# Patient Record
Sex: Female | Born: 1940 | Race: Black or African American | Hispanic: No | Smoking: Former smoker
Health system: Southern US, Community
[De-identification: ages and names within clinical notes are randomized; demographics above are authoritative.]

## PROBLEM LIST (undated history)

## (undated) DIAGNOSIS — E039 Hypothyroidism, unspecified: Secondary | ICD-10-CM

## (undated) DIAGNOSIS — Z86010 Personal history of colonic polyps: Secondary | ICD-10-CM

## (undated) DIAGNOSIS — I10 Essential (primary) hypertension: Secondary | ICD-10-CM

## (undated) DIAGNOSIS — Z860101 Personal history of adenomatous and serrated colon polyps: Secondary | ICD-10-CM

## (undated) DIAGNOSIS — R011 Cardiac murmur, unspecified: Secondary | ICD-10-CM

## (undated) DIAGNOSIS — M069 Rheumatoid arthritis, unspecified: Secondary | ICD-10-CM

## (undated) DIAGNOSIS — K279 Peptic ulcer, site unspecified, unspecified as acute or chronic, without hemorrhage or perforation: Secondary | ICD-10-CM

## (undated) DIAGNOSIS — K222 Esophageal obstruction: Secondary | ICD-10-CM

## (undated) DIAGNOSIS — K219 Gastro-esophageal reflux disease without esophagitis: Secondary | ICD-10-CM

## (undated) HISTORY — PX: ABDOMINAL HYSTERECTOMY: SHX81

## (undated) HISTORY — PX: FOOT SURGERY: SHX648

## (undated) HISTORY — DX: Gastro-esophageal reflux disease without esophagitis: K21.9

## (undated) HISTORY — DX: Rheumatoid arthritis, unspecified: M06.9

## (undated) HISTORY — PX: OTHER SURGICAL HISTORY: SHX169

## (undated) HISTORY — DX: Personal history of adenomatous and serrated colon polyps: Z86.0101

## (undated) HISTORY — DX: Personal history of colonic polyps: Z86.010

## (undated) HISTORY — DX: Esophageal obstruction: K22.2

## (undated) HISTORY — DX: Peptic ulcer, site unspecified, unspecified as acute or chronic, without hemorrhage or perforation: K27.9

---

## 1992-10-28 DIAGNOSIS — K279 Peptic ulcer, site unspecified, unspecified as acute or chronic, without hemorrhage or perforation: Secondary | ICD-10-CM

## 1992-10-28 HISTORY — DX: Peptic ulcer, site unspecified, unspecified as acute or chronic, without hemorrhage or perforation: K27.9

## 2000-05-06 ENCOUNTER — Ambulatory Visit (HOSPITAL_COMMUNITY): Admission: RE | Admit: 2000-05-06 | Discharge: 2000-05-06 | Payer: Self-pay | Admitting: *Deleted

## 2000-05-06 ENCOUNTER — Encounter: Payer: Self-pay | Admitting: *Deleted

## 2001-06-23 ENCOUNTER — Emergency Department (HOSPITAL_COMMUNITY): Admission: EM | Admit: 2001-06-23 | Discharge: 2001-06-23 | Payer: Self-pay | Admitting: *Deleted

## 2001-06-23 ENCOUNTER — Encounter: Payer: Self-pay | Admitting: *Deleted

## 2001-08-31 ENCOUNTER — Encounter (HOSPITAL_COMMUNITY): Admission: RE | Admit: 2001-08-31 | Discharge: 2001-09-30 | Payer: Self-pay | Admitting: Orthopedic Surgery

## 2001-09-30 ENCOUNTER — Encounter (HOSPITAL_COMMUNITY): Admission: RE | Admit: 2001-09-30 | Discharge: 2001-10-30 | Payer: Self-pay | Admitting: Orthopedic Surgery

## 2001-11-04 ENCOUNTER — Encounter (HOSPITAL_COMMUNITY): Admission: RE | Admit: 2001-11-04 | Discharge: 2001-12-04 | Payer: Self-pay | Admitting: Orthopedic Surgery

## 2002-01-21 ENCOUNTER — Encounter: Payer: Self-pay | Admitting: Rheumatology

## 2002-01-21 ENCOUNTER — Encounter (HOSPITAL_COMMUNITY): Admission: RE | Admit: 2002-01-21 | Discharge: 2002-02-20 | Payer: Self-pay | Admitting: Rheumatology

## 2002-02-25 ENCOUNTER — Encounter (HOSPITAL_COMMUNITY): Admission: RE | Admit: 2002-02-25 | Discharge: 2002-03-27 | Payer: Self-pay | Admitting: Rheumatology

## 2002-04-22 ENCOUNTER — Encounter (HOSPITAL_COMMUNITY): Admission: RE | Admit: 2002-04-22 | Discharge: 2002-05-22 | Payer: Self-pay | Admitting: Rheumatology

## 2002-07-08 ENCOUNTER — Encounter (HOSPITAL_COMMUNITY): Admission: RE | Admit: 2002-07-08 | Discharge: 2002-08-07 | Payer: Self-pay | Admitting: Rheumatology

## 2002-08-24 ENCOUNTER — Encounter (HOSPITAL_COMMUNITY): Admission: RE | Admit: 2002-08-24 | Discharge: 2002-09-23 | Payer: Self-pay | Admitting: Rheumatology

## 2002-08-31 ENCOUNTER — Ambulatory Visit (HOSPITAL_COMMUNITY): Admission: RE | Admit: 2002-08-31 | Discharge: 2002-08-31 | Payer: Self-pay | Admitting: Family Medicine

## 2002-08-31 ENCOUNTER — Encounter: Payer: Self-pay | Admitting: Family Medicine

## 2002-09-30 ENCOUNTER — Encounter (HOSPITAL_COMMUNITY): Admission: RE | Admit: 2002-09-30 | Discharge: 2002-10-30 | Payer: Self-pay | Admitting: Rheumatology

## 2002-10-28 DIAGNOSIS — K222 Esophageal obstruction: Secondary | ICD-10-CM

## 2002-10-28 HISTORY — PX: ESOPHAGOGASTRODUODENOSCOPY: SHX1529

## 2002-10-28 HISTORY — PX: COLONOSCOPY: SHX174

## 2002-10-28 HISTORY — DX: Esophageal obstruction: K22.2

## 2002-11-04 ENCOUNTER — Encounter (HOSPITAL_COMMUNITY): Admission: RE | Admit: 2002-11-04 | Discharge: 2002-12-04 | Payer: Self-pay | Admitting: Rheumatology

## 2002-12-23 ENCOUNTER — Encounter (HOSPITAL_COMMUNITY): Admission: RE | Admit: 2002-12-23 | Discharge: 2003-01-22 | Payer: Self-pay | Admitting: Rheumatology

## 2003-02-08 ENCOUNTER — Encounter (HOSPITAL_COMMUNITY): Admission: RE | Admit: 2003-02-08 | Discharge: 2003-03-10 | Payer: Self-pay | Admitting: Rheumatology

## 2003-03-15 ENCOUNTER — Encounter (HOSPITAL_COMMUNITY): Admission: RE | Admit: 2003-03-15 | Discharge: 2003-04-14 | Payer: Self-pay | Admitting: Rheumatology

## 2003-05-25 ENCOUNTER — Encounter: Payer: Self-pay | Admitting: Family Medicine

## 2003-05-25 ENCOUNTER — Ambulatory Visit (HOSPITAL_COMMUNITY): Admission: RE | Admit: 2003-05-25 | Discharge: 2003-05-25 | Payer: Self-pay | Admitting: Family Medicine

## 2003-05-26 ENCOUNTER — Encounter: Payer: Self-pay | Admitting: Family Medicine

## 2003-05-26 ENCOUNTER — Ambulatory Visit (HOSPITAL_COMMUNITY): Admission: RE | Admit: 2003-05-26 | Discharge: 2003-05-26 | Payer: Self-pay | Admitting: Family Medicine

## 2003-06-28 ENCOUNTER — Ambulatory Visit (HOSPITAL_COMMUNITY): Admission: RE | Admit: 2003-06-28 | Discharge: 2003-06-28 | Payer: Self-pay | Admitting: Internal Medicine

## 2003-07-21 ENCOUNTER — Encounter (HOSPITAL_COMMUNITY): Admission: RE | Admit: 2003-07-21 | Discharge: 2003-08-20 | Payer: Self-pay | Admitting: Rheumatology

## 2003-08-22 ENCOUNTER — Encounter (HOSPITAL_COMMUNITY): Admission: RE | Admit: 2003-08-22 | Discharge: 2003-09-21 | Payer: Self-pay | Admitting: Rheumatology

## 2006-05-20 ENCOUNTER — Ambulatory Visit (HOSPITAL_COMMUNITY): Admission: RE | Admit: 2006-05-20 | Discharge: 2006-05-20 | Payer: Self-pay | Admitting: Family Medicine

## 2006-08-28 HISTORY — PX: COLONOSCOPY: SHX174

## 2006-09-10 ENCOUNTER — Ambulatory Visit: Payer: Self-pay | Admitting: Internal Medicine

## 2006-09-10 ENCOUNTER — Ambulatory Visit (HOSPITAL_COMMUNITY): Admission: RE | Admit: 2006-09-10 | Discharge: 2006-09-10 | Payer: Self-pay | Admitting: Internal Medicine

## 2007-05-29 ENCOUNTER — Ambulatory Visit (HOSPITAL_COMMUNITY): Admission: RE | Admit: 2007-05-29 | Discharge: 2007-05-29 | Payer: Self-pay | Admitting: Family Medicine

## 2010-09-28 ENCOUNTER — Ambulatory Visit (HOSPITAL_COMMUNITY)
Admission: RE | Admit: 2010-09-28 | Discharge: 2010-09-28 | Payer: Self-pay | Source: Home / Self Care | Attending: Family Medicine | Admitting: Family Medicine

## 2011-01-29 ENCOUNTER — Ambulatory Visit (HOSPITAL_COMMUNITY)
Admission: RE | Admit: 2011-01-29 | Discharge: 2011-01-29 | Disposition: A | Discharge status: Home / Self Care | Payer: Medicare Other | Source: Ambulatory Visit | Attending: Cardiovascular Disease | Admitting: Cardiovascular Disease

## 2011-01-29 ENCOUNTER — Other Ambulatory Visit (HOSPITAL_COMMUNITY): Payer: Self-pay | Admitting: Cardiovascular Disease

## 2011-01-29 DIAGNOSIS — Z87891 Personal history of nicotine dependence: Secondary | ICD-10-CM | POA: Insufficient documentation

## 2011-01-29 DIAGNOSIS — J40 Bronchitis, not specified as acute or chronic: Secondary | ICD-10-CM | POA: Insufficient documentation

## 2011-03-15 NOTE — Consult Note (Signed)
NAMECICELY, ORTNER NO.:  [id number]   MEDICAL RECORD NO.:  [id number]                   PATIENT TYPE:   LOCATION:                                       FACILITY:   PHYSICIAN:  Aundra Dubin, M.D.            DATE OF BIRTH:   DATE OF CONSULTATION:  09/30/2002  DATE OF DISCHARGE:                                   CONSULTATION   CHIEF COMPLAINT:  Rheumatoid arthritis.   HISTORY OF PRESENT ILLNESS:  Labs on July 08, 2002 showed an AST 57 and  I called her to reduce the methotrexate to 12.5 mg weekly.  Labs repeated on  August 24, 2002 showed an AST of 36.  She reports being quite stiff and  achy, particularly in her hands.  She is taking 4000 mg of Tylenol most  days.  She is stiff in the mornings for about 30 minutes.  There has been no  URI's, fever, cough, nausea, shortness of breath.  Her weight is up 2  pounds.  One of her main complaints today is having significant heartburn.   MEDICATIONS:  1. Methotrexate 12.5 mg weekly.  2. Folic acid 1 mg q.d.  3. Multivitamin q.d.  4. Calcium 600 mg b.i.d.  5. Fish oil rare.   PHYSICAL EXAMINATION:  VITAL SIGNS:  Weight 223 pounds, blood pressure  110/60, respirations 16.  GENERAL:  No distress.  SKIN:  Clear.  LUNGS:  Clear.  NECK:  Negative JVD.  No adenopathy.  HEART:  Regular.  No murmur.  EXTREMITIES:  Lower extremities:  No edema.  MUSCULOSKELETAL:  The hands show mildly active RA with synovitis to the  PIP's, MCP's, and wrists.  These areas are tender more on the right then  left hand.  Elbows and shoulders good range of motion.  Back nontender.  Hips good range of motion.  The knees were cool and replaced.  The ankles  and feet have slight tenderness.   ASSESSMENT AND PLAN:  1. Rheumatoid arthritis.  I believe she is having moderate flaring to the     hands and chronic synovitis.  I will have her increase the methotrexate     to 17.5 mg weekly.  I have advised her to lower  the Tylenol to no more     than 2-4 tablets a day.  We were giving her an injection of 120 of Depo-     Medrol IM.  We will check labs again in five weeks.  2. Transient increased AST.  3. Heartburn.  I have suggested that she take Prilosec 20 mg q.d.   Other labs from August 24, 2002 showed an albumin of 4.3, WBC 4.6, HGB  12.3, PLT 203, creatinine 0.9.  She will return in three months.  Aundra Dubin, M.D.    WWT/MEDQ  D:  09/30/2002  T:  09/30/2002  Job:  [id number]   cc:   Patrica Duel, M.D.  [address]  [place]  [place] [address]  Fax: [phone]

## 2011-03-15 NOTE — Op Note (Signed)
NAME:  Loretta Hunt, Loretta Hunt                         ACCOUNT NO.:  [id number]   MEDICAL RECORD NO.:  [id number]                   PATIENT TYPE:  AMB   LOCATION:  DAY                                  FACILITY:  [facility]   PHYSICIAN:  R. Roetta Sessions, M.D.              DATE OF BIRTH:  1941-09-17   DATE OF PROCEDURE:  06/28/2003  DATE OF DISCHARGE:                                 OPERATIVE REPORT   PROCEDURE:  Esophagogastroduodenoscopy with Elease Hashimoto dilation followed by  colonoscopy and snare polypectomy.   INDICATIONS FOR PROCEDURE:  The patient is a 70 year old lady with  esophageal dysphagia, distal esophageal narrowing on upper GI series. She  has never had her lower GI tract evaluated. She comes for EGD and  colonoscopy. This approach has been discussed with the patient previously.  The potential risks, benefits, and alternatives have been reviewed,  questions answered. Please see my June 20, 2003 consultation note.   MONITORING:  O2 saturation, blood pressure, pulse, and respirations were  monitored throughout the entire procedure.   CONSCIOUS SEDATION:  Versed 3 mg IV, Demerol 75 mg IV. Cetacaine spray for  topical oropharyngeal anesthesia.   INSTRUMENTS:  Olympus video chip adult gastroscope and colonoscope.   EGD FINDINGS:  Examination of the tubular esophagus revealed a stellate  shaped distal esophageal ulcer at the EG junction, there was also a  Schatzki's ring. The esophageal mucosa otherwise appeared normal. The EG  junction easily traversed.   STOMACH:  The gastric cavity was empty and insufflated well with air. A  thorough examination of the gastric mucosa including a retroflexed view of  the proximal stomach and esophagogastric junction demonstrated only a small  hiatal hernia.  The pylorus was patent and easily traversed.   DUODENUM:  The bulb and second portion appeared normal.   THERAPEUTIC/DIAGNOSTIC MANEUVERS:  A 56 French Maloney dilator was passed  with ease  and a 28 [place] Malone dilator was subsequently passed with slight  resistance upon full insertion.  A look back revealed the ring had been  partially disrupted. Subsequently four corner bites with cold biopsy  forceps were taken to disrupt the ring additionally. This was done without  difficulty or apparent complications.   The patient tolerated the procedure well and was prepared for colonoscopy.  Digital rectal examination revealed no abnormalities.   ENDOSCOPIC FINDINGS:  Unfortunately the prep was marginal.   RECTUM:  Examination of the rectal mucosa including retroflexed view of the  anal verge revealed no abnormalities aside from some pigmentation to the  mucosa.   COLON:  The colonic mucosa was surveyed from the rectosigmoid junction  through the left transverse right colon to the area of the appendiceal  orifice, ileocecal valve and cecum. These structures were seen and  photographed for the record. The patient again was noted to have diffusely  pigmented mucosa all the way to the cecum. The patient had a  couple of  sigmoid diverticula and had some polyps in the mid ascending colon, the  largest was 6 mm. There was a 4 mm sessile polyp and then a 2 mm polyp,  please see photos. From the level of the cecum and ileocecal valve, the  scope was slowly and cautiously withdrawn. All previously mentioned mucosal  surfaces were again seen. The largest polyp in the mid ascending colon was  resected with snare as was the middle sized polyp. The smallest polyp was  obliterated with the tip of the snare cautery unit. The polyps resected were  recovered through the scope.  No other abnormalities were observed. The  patient tolerated the procedure well and was reacted in endoscopy.   IMPRESSION:  1. EGD.  Stellate shaped distal esophageal ulcer consistent with mild     ulcerative reflux esophagitis, Schatzki's ring dilated and disrupted as     described above, otherwise, normal esophagus.  Small hiatal hernia,     otherwise, normal stomach. Normal D1 and D2.  2. Colonoscopy findings.  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  3. Small polyps in the ascending colon removed and or distorted as described     above.   RECOMMENDATIONS:  1. No arthritis medications, aspirin, etc. for 10 days. Antireflux     literature provided to Ms. Loretta Hunt. She is to continue taking Protonix     40 mg orally daily.  2. Followup on path.  3. Office visit with me in one month.                                               Jonathon Bellows, M.D.    RMR/MEDQ  D:  06/28/2003  T:  06/28/2003  Job:  [id number]   cc:   Corrie Mckusick, M.D.  [address] [place]  [place]  [place] [address]  Fax: [phone]   Aundra Dubin, M.D.
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
## 2011-03-15 NOTE — Consult Note (Signed)
NAME:  Sadey, Yandell NO.:  [id number]   MEDICAL RECORD NO.:  [id number]                   PATIENT TYPE:   LOCATION:                                       FACILITY:   PHYSICIAN:  Aundra Dubin, M.D.            DATE OF BIRTH:   DATE OF CONSULTATION:  03/31/2003  DATE OF DISCHARGE:                                   CONSULTATION   CHIEF COMPLAINT:  Rheumatoid arthritis.   HISTORY OF PRESENT ILLNESS:  Ms. Vellucci had repeat labs in mid April  showing an AST 96.  We called and had her reduced to 10 mg of methotrexate  each week.  Repeat labs on Mar 15, 2003, showed an AST 32, creatinine 1.0,  albumin 4.1, WBC 7.4, HGB 13.0, PLT 214.  She is now on 12.5 mg of  methotrexate.  She reports that she is doing pretty well.  She aches in her  lower legs.  Her knees are replaced, and these will ache occasionally.  She  feels that her hands and wrists are not active at this time.  There has been  no URI, fever, cough, shortness of breath.  She was having possibly an upper  respiratory infection in mid April versus allergies.  There was no fever at  that time.   MEDICATIONS:  1. Methotrexate 12.5 mg q.week.  2. Folic acid 1 mg daily.  3. Calcium 600 mg b.i.d.  4. Multivitamin.  5. Tylenol p.r.n.  6. Nomie juice.   PHYSICAL EXAMINATION:  VITAL SIGNS:  Blood pressure 130/80, respirations 14,  pulse 74.  GENERAL APPEARANCE:  No distress.  LUNGS:  Clear.  HEART:  Regular, no murmur.  EXTREMITIES:  Lower extremities, no edema.  NECK:  Negative JVD.  BACK:  Nontender.  MUSCULOSKELETAL:  She has minor synovitis to the MCPs and wrist which are  cool and have slight tenderness.  Elbows extend fully.  No nodules.  Shoulders move with stiffness and are slightly uncomfortable.  The knees are  replaced and are mildly warm.  The ankles and feet have no tenderness.   ASSESSMENT/PLAN:  1. Rheumatoid arthritis.  She is stable and we will continue the  methotrexate 12.5 mg q.week.  She had x-rays of her hands in March 2003,     and I will have her hands x-rayed on return.  We will check labs again in     mid July and she will return in four months.  2.   Dictation ended at this point.                                               Aundra Dubin, M.D.    WWT/MEDQ  D:  03/31/2003  T:  03/31/2003  Job:  [id number]  cc:   Bonne Dolores, M.D.  [address], [place]  [place] [address]  Fax: [phone]

## 2011-03-15 NOTE — Op Note (Signed)
Loretta Hunt, Loretta Hunt               ACCOUNT NO.:  [id number]   MEDICAL RECORD NO.:  [id number]          PATIENT TYPE:  AMB   LOCATION:  DAY                           FACILITY:  [facility]   PHYSICIAN:  R. Roetta Sessions, M.D. DATE OF BIRTH:  October 28, 1941   DATE OF PROCEDURE:  09/10/2006  DATE OF DISCHARGE:                                 OPERATIVE REPORT   Surveillance colonoscopy.   INDICATIONS FOR PROCEDURE:  The patient is a 70 year old lady with history  of colonic adenomas and polyps, last colonoscopy 2004.  She is devoid of any  lower GI tract symptoms.  Colonoscopy now being done as a surveillance  maneuver.  This approach has discussed with the patient at length.  Potential risks, benefits and alternatives have been reviewed, questions  answered, she is agreeable.  Please see documentation in  the medical  record.   PROCEDURE NOTE:  O2 saturation, blood pressure, pulse and respirations  monitored entire procedure.   CONSCIOUS SEDATION:  Versed 3 mg IV, Demerol 75 mg IV, in divided doses.   INSTRUMENT USED:  Olympus video chip system.   FINDINGS:  Digital rectal exam revealed no abnormalities.   ENDOSCOPIC FINDINGS:  The prep was suboptimal but doable.  Rectum:  Examination of rectal mucosa including retroflex view of the anal verge  revealed anal papilla and internal hemorrhoids, otherwise rectal mucosa  appeared normal.  Colon:  Colonic mucosa was surveyed from the rectosigmoid junction through  the left, transverse, right colon and area of appendiceal orifice, ileocecal  valve and cecum.  These structures well seen and photographed for the  record.  From this level, the scope was slowly and cautiously withdrawn.  All previously mentioned mucosal surfaces were again seen.  The patient had  a long tortuous colon requiring external abdominal pressure and changing the  patient's position to reach the cecum.  The patient had diffusely pigmented  colonic mucosa consistent with  melanosis coli, scattered pancolonic  diverticula.  No evidence of polyp or neoplasm.  The patient tolerated the  procedure well, was reacted in endoscopy.   IMPRESSION:  Anal papilla, internal hemorrhoids, otherwise normal rectum,  long tortuous colon, pancolonic diverticula, melanosis coli.   RECOMMENDATIONS:  1. Diverticulosis literature provided Loretta Hunt..  2. Repeat colonoscopy 5 years.      Jonathon Bellows, M.D.  Electronically Signed     RMR/MEDQ  D:  09/10/2006  T:  09/10/2006  Job:  [id number]   cc:   Corrie Mckusick, M.D.  Fax: [phone]

## 2011-03-15 NOTE — Consult Note (Signed)
   Loretta Hunt, WAUNEKA NO.:  [id number]   MEDICAL RECORD NO.:  [id number]                   PATIENT TYPE:   LOCATION:                                       FACILITY:   PHYSICIAN:  Aundra Dubin, M.D.            DATE OF BIRTH:   DATE OF CONSULTATION:  12/23/2002  DATE OF DISCHARGE:                                   CONSULTATION   CHIEF COMPLAINT:  Rheumatoid arthritis.   HISTORY OF PRESENT ILLNESS:  The patient returns reporting that she is  feeling considerably better then she did in December.  At that time, we gave  her an injection of 120 mg of Depo-Medrol, which helped fairly quickly.  She  has been on the increased dose of methotrexate and is now feeling better.  She has little stiffness in the mornings rated at about 20 minutes.  Her  hands and wrists are much improved and there is only mild achiness.  She is  sleeping well.  Her weight is stable.  There has been no URI's, fever,  cough, nausea, stomatitis, or shortness of breath.   MEDICATIONS:  1. Methotrexate 12.5 mg weekly.  2. Folic acid 1 mg daily.  3. Calcium 600 mg b.i.d.  4. Multivitamin daily.   PHYSICAL EXAMINATION:  VITAL SIGNS:  Weight 222 pounds, blood pressure  100/70, respirations 18.  GENERAL:  She appears well.  NECK:  Negative JVD.  LUNGS:  Clear.  BACK:  Nontender.  HEART:  Regular.  MUSCULOSKELETAL:  There is mild chronic synovitis of the MCP's and wrist,  which is cool and essentially nontender.  Elbows extend fully, no nodules.  Shoulders good range of motion.  Knees, ankles, and feet have a good range  of motion and all are nontender and there is slight knee crepitation.    ASSESSMENT AND PLAN:  Rheumatoid arthritis.  She is stable and doing well  and we will continue the methotrexate and folic acid as above.  Labs checked  on November 04, 2002 showed a creatinine 1, albumin 3.8, AST 28, WBC 5.3, HCB  12.2, PLT 202.  Labs will be checked in two weeks and  then three months from  that point.   I will see her back in four months.                                               Aundra Dubin, M.D.    WWT/MEDQ  D:  12/23/2002  T:  12/23/2002  Job:  [id number]   cc:   Patrica Duel, M.D.  [address]  [place]  [place] [address]  Fax: [phone]

## 2011-03-15 NOTE — Consult Note (Signed)
[facility]  Patient:    Loretta Hunt, Loretta Hunt Visit Number: [id number] MRN: [id number]          Service Type: RHE Location: [facility] Attending Physician:  Aundra Dubin Dictated by:   Aundra Dubin, M.D. Proc. Date: 03/18/02 Admit Date:  02/25/2002   CC:         Patrica Duel, M.D.   Consultation Report  CHIEF COMPLAINT:  Rheumatoid arthritis.  BRIEF HISTORY:  The patient labs on January 21, 2002, showed an AST 53, and ALT 42, albumin 3.9, and creatinine 0.9.  We contacted her and had her take only 12.5 mg of methotrexate a week.  Repeat labs on Feb 25, 2002, showed an AST 39, creatinine 1.0, albumin 4.1, and a normal CBC.  Other labs from Mar 23, 2002, showed an RF 35 (0-20) and an ESR of 34.  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  The patient is reporting that she is hurting quite a bit in the hands and wrists.  She points to the MCPs. She says her knees, ankles and feet are not bothering her at this time.  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  There has been no URI, fevers, cough, nausea, stomatitis, or shortness of breath.  Her weight is stable  MEDICATIONS: 1. Methotrexate 12.5 q. week 2. Folic acid 1 mg q. day. 3. Multivitamin. 4. Vitamin E, calcium 600 mg q.d. 5. Fish oil p.r.n.  PHYSICAL EXAMINATION:  VITAL SIGNS:  Weight 220 lbs, blood pressure 130/80, respirations 18, pulse 72.  GENERAL:  She appears well.  SKIN:  Clear.  LUNGS:  Clear.  HEART:  Regular.  EXTREMITIES:  Lower extremities no edema.  MUSCULOSKELETAL:  She shows synovitis consistent with RA across the MCPs and in the wrist.  These areas were cool but moderately tender.  Elbows have good range of motion, the shoulders have decreased range of motion and were stiff. Back:  Nontender.  Hips: Good range  of motion.  The knees were replaced and Flexeril to 105 degrees bilaterally.  The ankles and feet were nonswollen and nontender.  Her gait was nonantalgic.  ASSESSMENT AND PLAN:  Rheumatoid arthritis.  The liver enzymes are now normal and I will have her increase the methotrexate to 17.5 mg q. week.  She reports that she had some type of problems with taking this amount of MTX.  I have her take 4 pills q. Wednesday a.m. and 3 pills q. Thursday a.m. all well-nourished 24 hours.  We will check usual labs in about 4 weeks after the increased MTX.  Her x-rays look quite good and this is probably due to the fact that she has been on methotrexate for several years.  FOLLOWUP:  She will return in about 2 months. Dictated by:   Aundra Dubin, M.D. Attending Physician:  Aundra Dubin DD:  03/18/02 TD:  03/20/02 Job: [id number]

## 2011-03-15 NOTE — Consult Note (Signed)
Loretta Hunt, BERHOW NO.:  [id number]   MEDICAL RECORD NO.:  [id number]                    PATIENT TYPE:   LOCATION:                                       FACILITY:   PHYSICIAN:  Aundra Dubin, M.D.            DATE OF BIRTH:   DATE OF CONSULTATION:  07/08/2002  DATE OF DISCHARGE:                                   CONSULTATION   CHIEF COMPLAINT:  Rheumatoid arthritis.   HISTORY:  This is the first office visit since Mar 18, 2002 for the patient.  She had to cancel an appointment and push the office visit back.  She did go  for laboratories on April 22, 2002, and AST was slightly high at 45.  Other  laboratories showed WBC 5.6, HGB 12.7, PLT 223,000, creatinine 1.0, albumin  4.0.  She was contacted and reduced the methotrexate to 15 mg every week.  At this point she is saying that she is quite stiff.  When asked how long  this goes on in the morning, it is only for 15-20 minutes.  She also has  moderate fatigue.  She is an active woman.  She continues to exercise and do  a great deal of activities.  There has been no URI, fever, cough, nausea,  shortness of breath, stomatitis, back pain, polyuria, or polydipsia.  Her  overall aching is fairly mild.  It seems to involve the wrists and feet.   MEDICATIONS:  1. Methotrexate 15 mg every week.  2. Folic acid 1 mg every day.  3. Multivitamin every day.  4. Vitamin E every day.  5. Calcium every day.  6. Fish oil p.r.n.   PHYSICAL EXAMINATION:  VITAL SIGNS:  Weight 221 pounds, blood pressure  140/80, respirations 18.  GENERAL:  She appears healthy.  SKIN:  Clear.  LUNGS:  Clear.  HEART:  Regular.  No murmur.  NECK:  Negative JVD.  MUSCULOSKELETAL:  She has mild chronic swelling to the MCPs and wrists.  These areas are cool and slightly tender.  Elbows, shoulders have a good  range of motion.  The back was nontender.  The knees are replaced and were  cool.  The left ankle, which has a surgical  screw, is warm and tender.  The  right ankle and bilateral MTPs are nontender.   ASSESSMENT AND PLAN:  Rheumatoid arthritis.  She is stable on 15 mg  __________ and we will continue at that level.  We will check usual  laboratories at this time.  She will continue with folic acid.  She  occasionally uses ibuprofen, which is fine, and also Tylenol up to 500 mg  t.i.d., which is okay.   I have discussed with her the arthritis itself is frequently associated with  significant fatigue.  I suspect this is what is going on.  She has not been  anemic in the past.  She will return in three months.                                               Aundra Dubin, M.D.    WWT/MEDQ  D:  07/08/2002  T:  07/09/2002  Job:  [id number]   cc:   Jonell Cluck, M.D.  [address], [address]  [place]  [place] [address]  Fax: [phone]

## 2011-03-15 NOTE — Consult Note (Signed)
NAME:  Loretta, Hunt NO.:  [id number]   MEDICAL RECORD NO.:  [id number]                   PATIENT TYPE:   LOCATION:                                       FACILITY:   PHYSICIAN:  Aundra Dubin, M.D.            DATE OF BIRTH:   DATE OF CONSULTATION:  DATE OF DISCHARGE:                                   CONSULTATION   CHIEF COMPLAINT:  Rheumatoid arthritis.   Loretta Hunt returns reporting that her hands and wrists were moderately  aching.  She says though she has minimal morning stiffness of about 10  minutes.  She has had some cough and allergy problems, but is not short of  breath and there has been no fever.  Her weight is stable. There is no  polyuria or polydipsia, visual changes or significant headaches.   Labs from May 26, 2003 shows a WBC 5.0, hemoglobin 12.6, platelets 186.  Glucose 91, creatinine 1.1, AST 24, albumin 4.2.  She has had difficulties  with swallowing and some chest pain over the last couple of months. She saw  Dr. Jena Gauss and has been found to have a hiatal hernia, and is now on Prevacid  which has helped.  She also had a colonoscopy a month ago which found some  polyps.   MEDICINES:  1. Methotrexate 12.5 mg every week.  2. Folic acid 1 mg daily.  3. Tylenol p.r.n.  4. Calcium 600 mg b.i.d.  5. Multivitamin.  6. Prevacid 40 mg daily.  7. Clarinex p.r.n.   PHYSICAL EXAMINATION:  VITAL SIGNS:  Weight 218 pounds.  Blood pressure  120/60, respirations 16.  GENERAL:  No distress.  SKIN: Clear.  LUNGS:  Clear.  HEART:  Regular no murmurs.  MUSCULOSKELETAL EXAM: The hand shows synovitis to the PIPs, MCPs and wrists  which were moderately tender, but all quite cool.  There is some pain with  wrist flexion.  Elbows and shoulders good range of motion with moderate  shoulder stiffness.  The knees are cool and are replaced.  Ankles and feet  were nontender.   ASSESSMENT/PLAN:  1. Rheumatoid arthritis.  Overall she is  fairly stable, but she still has     significant pain with mild palpation to the hands and wrists.  I have     asked her to increase the methotrexate to 15 mg every week.  There is     some resistance     in doing this, and I am not sure if she will or not.  She will return     during the week of October 22 for labs.  2. Heartburn and hiatal hernia.  This has improved with the Prevacid.  3. She will return in mid-December to see me in my [place] office.      ___________________________________________  Aundra Dubin, M.D.   WWT/MEDQ  D:  07/21/2003  T:  07/21/2003  Job:  [id number]   cc:   Patrica Duel, M.D.  [address]  [place]  [place] [address]  Fax: [phone]

## 2011-03-15 NOTE — Consult Note (Signed)
NAME:  Loretta Hunt, Loretta Hunt NO.:  [id number]   MEDICAL RECORD NO.:  [id number]                   PATIENT TYPE:   LOCATION:                                       FACILITY:   PHYSICIAN:  R. Roetta Sessions, M.D.              DATE OF BIRTH:  May 02, 1941   DATE OF CONSULTATION:  06/20/2003  DATE OF DISCHARGE:                                   CONSULTATION   REFERRING PHYSICIAN:  Corrie Mckusick, M.D.   REASON FOR CONSULTATION:  Abnormal upper GI series and dysphagia.   HISTORY OF PRESENT ILLNESS:  Loretta Hunt is a pleasant, 70 year old,  African-American female sent over at the request of Dr. Phillips Odor to further  evaluate intermittent esophageal dysphagia.  Occasional, Loretta Hunt feels  that food sticks behind her breast bone.  Sometimes she has to vomit.  Recent upper GI series demonstrated small hiatal hernia with concentric  narrowing of the EG junction.  The barium pill passed at this level, but the  barium did hang up at this area.  This was actually an esophagogram.  She  has reflux symptoms daily.  She takes Tums after almost every meal.  She was  started on Protonix 4 mg daily, but does not take this medication every day.  She does not like taking medication and takes it sparingly.  She has not had  any melena or rectal bleeding with no change in bowel habits.  She has never  had a colonoscopy.  I saw this nice lady back in 1994, when she was found to  have a gastric ulcer in upper GI series.  EGD confirmed gastric ulcer.  She  had evidence of H. pylori with a positive CLOtest.  She was treated with  triple drug therapy and has done well since.  She does not use any  nonsteroidal agents.  She has a history of rheumatoid arthritis and takes  methotrexate.   PAST MEDICAL HISTORY:  1. Gastroesophageal reflux disease.  2. H. pylori related peptic ulcer disease.  3. Rheumatoid arthritis followed by Dr. Kellie Simmering.   PAST SURGICAL HISTORY:  1. Left ankle  surgery.  2. Bilateral knee replacements in 1994 and 1995.   CURRENT MEDICATIONS:  1. Vitamin supplementation.  2. Claritin D p.r.n.  3. B-complex.  4. Tylenol for arthritis.  5. Protonix 40 mg daily.  6. Calcium two tablets daily.  7. Folic acid 1 mg daily.  8. Methotrexate once weekly.   ALLERGIES:  OVER-THE-COUNTER ANTIHISTAMINES.   FAMILY HISTORY:  Father succumbed to stomach ulcer.  Mother had arthritis  with no history of chronic GI or liver disease, otherwise as far as  neoplasia, etc. is concerned.   SOCIAL HISTORY:  She has three children.  She is widowed.  She is retired  from the [employer].  She quit smoking 10 years ago.  No  alcohol.   REVIEW OF SYMPTOMS:  No  chest pain, dyspnea on exertion.  No fevers, chills,  no change in weight.   PHYSICAL EXAMINATION:  GENERAL:  This is a pleasant, 70 year old lady  resting comfortably.  Weight 224 pounds.  Height 6 feet.  VITAL SIGNS:  Temperature 98.5, blood pressure 120/76, pulse 72.  SKIN:  Warm and dry.  HEENT:  No scleral icterus.  JVD is not prominent.  CHEST:  Lungs clear to auscultation.  CARDIAC:  Regular rate and rhythm without murmurs, rubs or gallops.  BREASTS:  Deferred.  ABDOMEN:  Nondistended, positive bowel sounds.  Soft, nontender without  appreciable mass or organomegaly.  EXTREMITIES:  No edema.  RECTAL:  Deferred at the time of colonoscopy.   LABORATORY DATA AND X-RAY FINDINGS:  On May 26, 2003, CBC with white count  5000, H&H 12.6 and 38.9, MCV 89.  Chem 20 was completely normal.  Cholesterol profile with LDL 50, HDL 38, total cholesterol 161.   IMPRESSION:  Loretta Hunt is a pleasant, 70 year old lady with  intermittent esophageal dysphagia and frequent gastroesophageal reflux  disease symptoms.  A recent upper gastrointestinal series demonstrated  concentric narrowing at the esophageal junction, although barium pill passed  without difficulty.  The barium appeared to hold  up.  We know she has a  hiatal hernia.  She may have underlying Schatzki's ring.  She has a history  of Helicobacter pylori related peptic ulcer disease.  She has been treated  for Helicobacter pylori.  As a separate issue, she notes she has never had a  colonoscopy and would like to get a colonoscopy performed.   RECOMMENDATIONS:  Given her symptoms and findings on upper GI series, she  needs an EGD with possible esophageal dilation.  I have discussed this  approach with her.  I have also offered her a colonoscopy at the same time.  Potential risks, benefits and alternatives have been reviewed for both  procedures.  Questions were answered and she is agreeable.  Will plan to  perform colonoscopy and EGD with possible dilatation in the very near  future.   I would like to thank Dr. Phillips Odor for letting me see this nice lady.                                               Jonathon Bellows, M.D.    RMR/MEDQ  D:  06/20/2003  T:  06/20/2003  Job:  [id number]   cc:   Corrie Mckusick, M.D.  [address]  [place]  [place] [address]  Fax: [phone]

## 2011-07-25 ENCOUNTER — Encounter: Payer: Self-pay | Admitting: Internal Medicine

## 2011-07-30 ENCOUNTER — Encounter: Payer: Self-pay | Admitting: Gastroenterology

## 2011-07-30 ENCOUNTER — Ambulatory Visit (INDEPENDENT_AMBULATORY_CARE_PROVIDER_SITE_OTHER): Payer: Medicare Other | Admitting: Gastroenterology

## 2011-07-30 DIAGNOSIS — K5900 Constipation, unspecified: Secondary | ICD-10-CM

## 2011-07-30 DIAGNOSIS — R1310 Dysphagia, unspecified: Secondary | ICD-10-CM | POA: Insufficient documentation

## 2011-07-30 DIAGNOSIS — D126 Benign neoplasm of colon, unspecified: Secondary | ICD-10-CM

## 2011-07-30 DIAGNOSIS — R1314 Dysphagia, pharyngoesophageal phase: Secondary | ICD-10-CM

## 2011-07-30 DIAGNOSIS — K5909 Other constipation: Secondary | ICD-10-CM | POA: Insufficient documentation

## 2011-07-30 MED ORDER — POLYETHYLENE GLYCOL 3350 GRAN: 17.0000 g | GRANULES | Freq: Every day | Status: DC | PRN

## 2011-07-30 NOTE — Assessment & Plan Note (Signed)
Chronic constipation. Continue daily activities. Advair lack 17 g at bedtime if no bowel movement during the day. Colonoscopy as planned.

## 2011-07-30 NOTE — Patient Instructions (Addendum)
We have scheduled you for an upper endoscopy and colonoscopy with Dr.Rourk. Please see separate instructions. For constipation, take MiraLax 17 g mixed in 4-6 ounces of any fluid at bedtime if you've not had a bowel movement within that day. A prescription for generic MiraLax was sent to [facility] in [place]. If your insurance does not cover this, you may buy it over-the-counter.

## 2011-07-30 NOTE — Progress Notes (Signed)
Primary Care Physician:  Kirk Ruths, MD  Primary Gastroenterologist:  Roetta Sessions, MD   Chief Complaint  Patient presents with  . Colonoscopy    HPI:  Loretta Hunt is a 70 y.o. female here to schedule five-year surveillance colonoscopy for history of adenomatous colon polyps. Only complaints are of constipation. BM QOD. Drink tea to have BM. Doesn't seem to help much. Stools are hard. No rectal pain. No melena, brbpr. Fleet suppositories occasionally. [employer] daily, ride bicycle every day, water exercise on M/W/F. No abdominal pain, heartburn. Recent cardiac work-up negative, [facility]. On Mobic for awhile. Take prn joint pain. Some solid food dysphagia. Has to wash food down with lots of liquid. No pill dysphagia. No odynophagia. No abdominal pain.  Current Outpatient Prescriptions  Medication Sig Dispense Refill  . aspirin 81 MG tablet Take 81 mg by mouth daily.        . fish oil-omega-3 fatty acids 1000 MG capsule Take 1 g by mouth 2 (two) times daily.        Marland Kitchen levothyroxine (SYNTHROID, LEVOTHROID) 100 MCG tablet Take 100 mcg by mouth daily.       . meloxicam (MOBIC) 7.5 MG tablet Take 7.5 mg by mouth daily. Take 2 tablets daily       . Prenatal Vit-Fe Fumarate-FA (M-VIT PO) Take 1 capsule by mouth daily.        . Polyethylene Glycol 3350 GRAN Take 17 g by mouth daily as needed. Take at bedtime if no bowel movement that day.  527 g  5    Allergies as of 07/30/2011  . (No Known Allergies)    Past Medical History  Diagnosis Date  . Rheumatoid arthritis        . Hx of adenomatous colonic polyps   . GERD (gastroesophageal reflux disease)   . PUD (peptic ulcer disease) 1994    h.pylori  . Schatzki's ring 2004    s/p dilation    Past Surgical History  Procedure Date  . Knee replacement surgery     bilateral, 1994/1995  . Foot surgery     left  . Colonoscopy 08/2006    Anal papilloma, internal hemorrhoids. Recommended to have repeat colonoscopy in  November 2012  . Colonoscopy 2004    Melanosis coli (mild), couple of scattered sigmoid diverticula, small polyps and a descending colon. Adenomatous polyps.  . Esophagogastroduodenoscopy 2004    Mild ulcerative reflux esophagitis, Schatzki ring dilated and disrupted, small hiatal hernia    Family History  Problem Relation Age of Onset  . Stomach cancer Father   . Colon cancer Neg Hx     History   Social History  . Marital Status: Widowed    Spouse Name: N/A    Number of Children: 3  . Years of Education: N/A   Occupational History  . retired from [employer]    Social History Main Topics  . Smoking status: Former Smoker -- 0.5 packs/day    Types: Cigarettes  . Smokeless tobacco: Former Neurosurgeon    Quit date: 12/11/1992  . Alcohol Use: Yes     a pint a week. None since 1994.  . Drug Use: No  . Sexually Active: Not on file   Other Topics Concern  . Not on file   Social History Narrative  . No narrative on file      ROS:  General: Negative for anorexia, weight loss, fever, chills, fatigue, weakness. Eyes: Negative for vision changes.  ENT: Negative for hoarseness, difficulty  swallowing , nasal congestion. CV: Negative for chest pain, angina, palpitations, dyspnea on exertion, peripheral edema.  Respiratory: Negative for dyspnea at rest, dyspnea on exertion, cough, sputum, wheezing.  GI: See history of present illness. GU:  Negative for dysuria, hematuria, urinary incontinence, urinary frequency, nocturnal urination.  MS:  Some joint pain due to arthritis takes Mobic occasionally. No low back pain.  Derm: Negative for rash or itching.  Neuro: Negative for weakness, abnormal sensation, seizure, frequent headaches, memory loss, confusion.  Psych: Negative for anxiety, depression, suicidal ideation, hallucinations.  Endo: Negative for unusual weight change.  Heme: Negative for bruising or bleeding. Allergy: Negative for rash or hives.    Physical Examination:  BP 125/70   Pulse 75  Temp(Src) 97.8 F (36.6 C) (Temporal)  Ht 6' (1.829 m)  Wt 226 lb 6.4 oz (102.694 kg)  BMI 30.71 kg/m2   General: Well-nourished, well-developed in no acute distress.  Head: Normocephalic, atraumatic.   Eyes: Conjunctiva pink, no icterus. Mouth: Oropharyngeal mucosa moist and pink , no lesions erythema or exudate. Neck: Supple without thyromegaly, masses, or lymphadenopathy.  Lungs: Clear to auscultation bilaterally.  Heart: Regular rate and rhythm, no murmurs rubs or gallops.  Abdomen: Bowel sounds are normal, nontender, nondistended, no hepatosplenomegaly or masses, no abdominal bruits or    hernia , no rebound or guarding.   Rectal: Deferred to time of colonoscopy. Extremities: No lower extremity edema. No clubbing or deformities.  Neuro: Alert and oriented x 4 , grossly normal neurologically.  Skin: Warm and dry, no rash or jaundice.   Psych: Alert and cooperative, normal mood and affect.

## 2011-07-30 NOTE — Assessment & Plan Note (Signed)
Due for surveillance colonoscopy.  I have discussed the risks, alternatives, benefits with regards to but not limited to the risk of reaction to medication, bleeding, infection, perforation and the patient is agreeable to proceed. Written consent to be obtained.  

## 2011-07-30 NOTE — Assessment & Plan Note (Addendum)
Solid food dysphagia. She has a history of ulcerative reflux esophagitis and Schatzki ring requiring disruption. Recommend EGD with dilation in the near future.  I have discussed the risks, alternatives, benefits with regards to but not limited to the risk of reaction to medication, bleeding, infection, perforation and the patient is agreeable to proceed. Written consent to be obtained.

## 2011-07-30 NOTE — Progress Notes (Signed)
Cc to PCP 

## 2011-08-16 ENCOUNTER — Other Ambulatory Visit (HOSPITAL_COMMUNITY): Payer: Self-pay | Admitting: Cardiovascular Disease

## 2011-08-16 DIAGNOSIS — K222 Esophageal obstruction: Secondary | ICD-10-CM

## 2011-08-16 DIAGNOSIS — R1310 Dysphagia, unspecified: Secondary | ICD-10-CM

## 2011-08-16 DIAGNOSIS — D126 Benign neoplasm of colon, unspecified: Secondary | ICD-10-CM

## 2011-08-16 DIAGNOSIS — K5730 Diverticulosis of large intestine without perforation or abscess without bleeding: Secondary | ICD-10-CM

## 2011-08-16 DIAGNOSIS — Z1211 Encounter for screening for malignant neoplasm of colon: Secondary | ICD-10-CM

## 2011-08-16 DIAGNOSIS — E041 Nontoxic single thyroid nodule: Secondary | ICD-10-CM

## 2011-08-16 DIAGNOSIS — Z86010 Personal history of colonic polyps: Secondary | ICD-10-CM

## 2011-08-20 ENCOUNTER — Ambulatory Visit (HOSPITAL_COMMUNITY)
Admission: RE | Admit: 2011-08-20 | Discharge: 2011-08-20 | Disposition: A | Discharge status: Home / Self Care | Payer: Medicare Other | Source: Ambulatory Visit | Attending: Cardiovascular Disease | Admitting: Cardiovascular Disease

## 2011-08-20 DIAGNOSIS — E049 Nontoxic goiter, unspecified: Secondary | ICD-10-CM | POA: Insufficient documentation

## 2011-08-20 DIAGNOSIS — E041 Nontoxic single thyroid nodule: Secondary | ICD-10-CM

## 2011-08-23 MED ORDER — SODIUM CHLORIDE 0.45 % IV SOLN
Freq: Once | INTRAVENOUS | Status: AC
  Administered 2011-08-26: 08:00:00 via INTRAVENOUS

## 2011-08-26 ENCOUNTER — Ambulatory Visit (HOSPITAL_COMMUNITY)
Admission: RE | Admit: 2011-08-26 | Discharge: 2011-08-26 | Disposition: A | Discharge status: Home / Self Care | Payer: Medicare Other | Source: Ambulatory Visit | Attending: Internal Medicine | Admitting: Internal Medicine

## 2011-08-26 ENCOUNTER — Encounter (HOSPITAL_COMMUNITY)
Admission: RE | Disposition: A | Discharge status: Home / Self Care | Payer: Self-pay | Source: Ambulatory Visit | Attending: Internal Medicine

## 2011-08-26 ENCOUNTER — Other Ambulatory Visit: Payer: Self-pay | Admitting: Internal Medicine

## 2011-08-26 ENCOUNTER — Encounter (HOSPITAL_COMMUNITY): Payer: Self-pay

## 2011-08-26 DIAGNOSIS — R1310 Dysphagia, unspecified: Secondary | ICD-10-CM | POA: Insufficient documentation

## 2011-08-26 DIAGNOSIS — D126 Benign neoplasm of colon, unspecified: Secondary | ICD-10-CM

## 2011-08-26 DIAGNOSIS — K5909 Other constipation: Secondary | ICD-10-CM

## 2011-08-26 DIAGNOSIS — Z7982 Long term (current) use of aspirin: Secondary | ICD-10-CM | POA: Insufficient documentation

## 2011-08-26 DIAGNOSIS — K5730 Diverticulosis of large intestine without perforation or abscess without bleeding: Secondary | ICD-10-CM | POA: Insufficient documentation

## 2011-08-26 DIAGNOSIS — Z860100 Personal history of colon polyps, unspecified: Secondary | ICD-10-CM | POA: Insufficient documentation

## 2011-08-26 HISTORY — DX: Cardiac murmur, unspecified: R01.1

## 2011-08-26 HISTORY — PX: COLONOSCOPY: SHX5424

## 2011-08-26 SURGERY — COLONOSCOPY
Anesthesia: Moderate Sedation

## 2011-08-26 MED ORDER — STERILE WATER FOR IRRIGATION IR SOLN
Status: DC | PRN
  Administered 2011-08-26: 09:00:00

## 2011-08-26 MED ORDER — BUTAMBEN-TETRACAINE-BENZOCAINE 2-2-14 % EX AERO
INHALATION_SPRAY | CUTANEOUS | Status: DC | PRN
  Administered 2011-08-26: 1 via TOPICAL

## 2011-08-26 MED ORDER — MIDAZOLAM HCL 5 MG/5ML IJ SOLN
INTRAMUSCULAR | Status: DC | PRN
  Administered 2011-08-26: 1 mg via INTRAVENOUS
  Administered 2011-08-26: 2 mg via INTRAVENOUS

## 2011-08-26 MED ORDER — MIDAZOLAM HCL 5 MG/5ML IJ SOLN
INTRAMUSCULAR | Status: AC
  Filled 2011-08-26: qty 10

## 2011-08-26 MED ORDER — MEPERIDINE HCL 100 MG/ML IJ SOLN
INTRAMUSCULAR | Status: DC | PRN
  Administered 2011-08-26: 50 mg via INTRAVENOUS

## 2011-08-26 MED ORDER — MEPERIDINE HCL 100 MG/ML IJ SOLN
INTRAMUSCULAR | Status: AC
  Filled 2011-08-26: qty 2

## 2011-08-26 MED ORDER — PANTOPRAZOLE SODIUM 40 MG PO TBEC
40.0000 mg | DELAYED_RELEASE_TABLET | Freq: Every day | ORAL | Status: DC
  Filled 2011-08-26 (×2): qty 1

## 2011-08-26 NOTE — H&P (Signed)
Tana Coast, PA 07/30/2011 12:41 PM Signed  Primary Care Physician: Kirk Ruths, MD  Primary Gastroenterologist: Roetta Sessions, MD  Chief Complaint   Patient presents with   .  Colonoscopy    HPI: Loretta Hunt is a 70 y.o. female here to schedule five-year surveillance colonoscopy for history of adenomatous colon polyps. Only complaints are of constipation. BM QOD. Drink tea to have BM. Doesn't seem to help much. Stools are hard. No rectal pain. No melena, brbpr. Fleet suppositories occasionally. [employer] daily, ride bicycle every day, water exercise on M/W/F. No abdominal pain, heartburn. Recent cardiac work-up negative, [facility]. On Mobic for awhile. Take prn joint pain. Some solid food dysphagia. Has to wash food down with lots of liquid. No pill dysphagia. No odynophagia. No abdominal pain.  Current Outpatient Prescriptions   Medication  Sig  Dispense  Refill   .  aspirin 81 MG tablet  Take 81 mg by mouth daily.     .  fish oil-omega-3 fatty acids 1000 MG capsule  Take 1 g by mouth 2 (two) times daily.     Marland Kitchen  levothyroxine (SYNTHROID, LEVOTHROID) 100 MCG tablet  Take 100 mcg by mouth daily.     .  meloxicam (MOBIC) 7.5 MG tablet  Take 7.5 mg by mouth daily. Take 2 tablets daily     .  Prenatal Vit-Fe Fumarate-FA (M-VIT PO)  Take 1 capsule by mouth daily.     .  Polyethylene Glycol 3350 GRAN  Take 17 g by mouth daily as needed. Take at bedtime if no bowel movement that day.  527 g  5    Allergies as of 07/30/2011   .  (No Known Allergies)    Past Medical History   Diagnosis  Date   .  Rheumatoid arthritis        .  Hx of adenomatous colonic polyps    .  GERD (gastroesophageal reflux disease)    .  PUD (peptic ulcer disease)  1994     h.pylori   .  Schatzki's ring  2004     s/p dilation    Past Surgical History   Procedure  Date   .  Knee replacement surgery      bilateral, 1994/1995   .  Foot surgery      left   .  Colonoscopy  08/2006     Anal  papilloma, internal hemorrhoids. Recommended to have repeat colonoscopy in November 2012   .  Colonoscopy  2004     Melanosis coli (mild), couple of scattered sigmoid diverticula, small polyps and a descending colon. Adenomatous polyps.   .  Esophagogastroduodenoscopy  2004     Mild ulcerative reflux esophagitis, Schatzki ring dilated and disrupted, small hiatal hernia    Family History   Problem  Relation  Age of Onset   .  Stomach cancer  Father    .  Colon cancer  Neg Hx     History    Social History   .  Marital Status:  Widowed     Spouse Name:  N/A     Number of Children:  3   .  Years of Education:  N/A    Occupational History   .  retired from [employer]     Social History Main Topics   .  Smoking status:  Former Smoker -- 0.5 packs/day     Types:  Cigarettes   .  Smokeless tobacco:  Former Neurosurgeon  Quit date:  12/11/1992   .  Alcohol Use:  Yes      a pint a week. None since 1994.   .  Drug Use:  No   .  Sexually Active:  Not on file    Other Topics  Concern   .  Not on file    Social History Narrative   .  No narrative on file    ROS:  General: Negative for anorexia, weight loss, fever, chills, fatigue, weakness.  Eyes: Negative for vision changes.  ENT: Negative for hoarseness, difficulty swallowing , nasal congestion.  CV: Negative for chest pain, angina, palpitations, dyspnea on exertion, peripheral edema.  Respiratory: Negative for dyspnea at rest, dyspnea on exertion, cough, sputum, wheezing.  GI: See history of present illness.  GU: Negative for dysuria, hematuria, urinary incontinence, urinary frequency, nocturnal urination.  MS: Some joint pain due to arthritis takes Mobic occasionally. No low back pain.  Derm: Negative for rash or itching.  Neuro: Negative for weakness, abnormal sensation, seizure, frequent headaches, memory loss, confusion.  Psych: Negative for anxiety, depression, suicidal ideation, hallucinations.  Endo: Negative for unusual weight  change.  Heme: Negative for bruising or bleeding.  Allergy: Negative for rash or hives.  Physical Examination:  BP 125/70  Pulse 75  Temp(Src) 97.8 F (36.6 C) (Temporal)  Ht 6' (1.829 m)  Wt 226 lb 6.4 oz (102.694 kg)  BMI 30.71 kg/m2  General: Well-nourished, well-developed in no acute distress.  Head: Normocephalic, atraumatic.  Eyes: Conjunctiva pink, no icterus.  Mouth: Oropharyngeal mucosa moist and pink , no lesions erythema or exudate.  Neck: Supple without thyromegaly, masses, or lymphadenopathy.  Lungs: Clear to auscultation bilaterally.  Heart: Regular rate and rhythm, no murmurs rubs or gallops.  Abdomen: Bowel sounds are normal, nontender, nondistended, no hepatosplenomegaly or masses, no abdominal bruits or hernia , no rebound or guarding.  Rectal: Deferred to time of colonoscopy.  Extremities: No lower extremity edema. No clubbing or deformities.  Neuro: Alert and oriented x 4 , grossly normal neurologically.  Skin: Warm and dry, no rash or jaundice.  Psych: Alert and cooperative, normal mood and affect.   [place] Score 07/30/2011 1:44 PM Signed  Cc to PCP Constipation, chronic - Tana Coast, PA 07/30/2011 12:37 PM Signed  Chronic constipation. Continue daily activities. Advair lack 17 g at bedtime if no bowel movement during the day. Colonoscopy as planned. Esophageal dysphagia - Tana Coast, PA 07/30/2011 12:38 PM Addendum  Solid food dysphagia. She has a history of ulcerative reflux esophagitis and Schatzki ring requiring disruption. Recommend EGD with dilation in the near future. I have discussed the risks, alternatives, benefits with regards to but not limited to the risk of reaction to medication, bleeding, infection, perforation and the patient is agreeable to proceed. Written consent to be obtained.   Previous Version  Colon adenomas - Tana Coast, [place] 07/30/2011 12:38 PM Signed  Due for surveillance colonoscopy. I have discussed the risks, alternatives,  benefits with regards to but not limited to the risk of reaction to medication, bleeding, infection, perforation and the patient is agreeable to proceed. Written consent to be obtained.     I have seen & examined the patient prior to the procedure(s) today and reviewed the history and physical/consultation.  There have been no changes.  After consideration of the risks, benefits, alternatives and imponderables, the patient has consented to the procedure(s).

## 2011-08-27 ENCOUNTER — Other Ambulatory Visit (HOSPITAL_COMMUNITY): Payer: Self-pay | Admitting: Family Medicine

## 2011-08-27 DIAGNOSIS — Z139 Encounter for screening, unspecified: Secondary | ICD-10-CM

## 2011-08-30 ENCOUNTER — Encounter (HOSPITAL_COMMUNITY): Payer: Self-pay | Admitting: Internal Medicine

## 2011-09-26 NOTE — Progress Notes (Signed)
REVIEWED.  

## 2011-10-07 ENCOUNTER — Ambulatory Visit (HOSPITAL_COMMUNITY): Payer: Medicare Other

## 2012-02-10 DIAGNOSIS — E039 Hypothyroidism, unspecified: Secondary | ICD-10-CM | POA: Diagnosis not present

## 2012-02-10 DIAGNOSIS — I4949 Other premature depolarization: Secondary | ICD-10-CM | POA: Diagnosis not present

## 2012-02-10 DIAGNOSIS — R9431 Abnormal electrocardiogram [ECG] [EKG]: Secondary | ICD-10-CM | POA: Diagnosis not present

## 2012-02-10 DIAGNOSIS — I10 Essential (primary) hypertension: Secondary | ICD-10-CM | POA: Diagnosis not present

## 2012-02-14 DIAGNOSIS — E119 Type 2 diabetes mellitus without complications: Secondary | ICD-10-CM | POA: Diagnosis not present

## 2012-02-14 DIAGNOSIS — R5381 Other malaise: Secondary | ICD-10-CM | POA: Diagnosis not present

## 2012-02-14 DIAGNOSIS — Z79899 Other long term (current) drug therapy: Secondary | ICD-10-CM | POA: Diagnosis not present

## 2012-02-14 DIAGNOSIS — E782 Mixed hyperlipidemia: Secondary | ICD-10-CM | POA: Diagnosis not present

## 2012-02-14 DIAGNOSIS — R0602 Shortness of breath: Secondary | ICD-10-CM | POA: Diagnosis not present

## 2012-02-14 DIAGNOSIS — E039 Hypothyroidism, unspecified: Secondary | ICD-10-CM | POA: Diagnosis not present

## 2012-03-03 DIAGNOSIS — Z6832 Body mass index (BMI) 32.0-32.9, adult: Secondary | ICD-10-CM | POA: Diagnosis not present

## 2012-03-03 DIAGNOSIS — E039 Hypothyroidism, unspecified: Secondary | ICD-10-CM | POA: Diagnosis not present

## 2012-03-03 DIAGNOSIS — M159 Polyosteoarthritis, unspecified: Secondary | ICD-10-CM | POA: Diagnosis not present

## 2012-03-03 DIAGNOSIS — I10 Essential (primary) hypertension: Secondary | ICD-10-CM | POA: Diagnosis not present

## 2012-03-03 DIAGNOSIS — J069 Acute upper respiratory infection, unspecified: Secondary | ICD-10-CM | POA: Diagnosis not present

## 2012-03-31 DIAGNOSIS — H52229 Regular astigmatism, unspecified eye: Secondary | ICD-10-CM | POA: Diagnosis not present

## 2012-03-31 DIAGNOSIS — H2510 Age-related nuclear cataract, unspecified eye: Secondary | ICD-10-CM | POA: Diagnosis not present

## 2012-03-31 DIAGNOSIS — H524 Presbyopia: Secondary | ICD-10-CM | POA: Diagnosis not present

## 2012-03-31 DIAGNOSIS — H5200 Hypermetropia, unspecified eye: Secondary | ICD-10-CM | POA: Diagnosis not present

## 2012-05-04 DIAGNOSIS — Z6832 Body mass index (BMI) 32.0-32.9, adult: Secondary | ICD-10-CM | POA: Diagnosis not present

## 2012-05-04 DIAGNOSIS — G8929 Other chronic pain: Secondary | ICD-10-CM | POA: Diagnosis not present

## 2012-05-04 DIAGNOSIS — E669 Obesity, unspecified: Secondary | ICD-10-CM | POA: Diagnosis not present

## 2012-07-24 DIAGNOSIS — Z23 Encounter for immunization: Secondary | ICD-10-CM | POA: Diagnosis not present

## 2012-08-19 ENCOUNTER — Other Ambulatory Visit (HOSPITAL_COMMUNITY): Payer: Self-pay | Admitting: Family Medicine

## 2012-08-19 DIAGNOSIS — I10 Essential (primary) hypertension: Secondary | ICD-10-CM | POA: Diagnosis not present

## 2012-08-19 DIAGNOSIS — L0390 Cellulitis, unspecified: Secondary | ICD-10-CM | POA: Diagnosis not present

## 2012-08-19 DIAGNOSIS — Z139 Encounter for screening, unspecified: Secondary | ICD-10-CM

## 2012-08-19 DIAGNOSIS — L0291 Cutaneous abscess, unspecified: Secondary | ICD-10-CM | POA: Diagnosis not present

## 2012-08-19 DIAGNOSIS — E039 Hypothyroidism, unspecified: Secondary | ICD-10-CM | POA: Diagnosis not present

## 2012-08-21 ENCOUNTER — Ambulatory Visit (HOSPITAL_COMMUNITY)
Admission: RE | Admit: 2012-08-21 | Discharge: 2012-08-21 | Disposition: A | Discharge status: Home / Self Care | Payer: Medicare Other | Source: Ambulatory Visit | Attending: Family Medicine | Admitting: Family Medicine

## 2012-08-21 DIAGNOSIS — M949 Disorder of cartilage, unspecified: Secondary | ICD-10-CM | POA: Insufficient documentation

## 2012-08-21 DIAGNOSIS — M899 Disorder of bone, unspecified: Secondary | ICD-10-CM | POA: Diagnosis not present

## 2012-08-21 DIAGNOSIS — Z13820 Encounter for screening for osteoporosis: Secondary | ICD-10-CM | POA: Insufficient documentation

## 2012-08-21 DIAGNOSIS — Z139 Encounter for screening, unspecified: Secondary | ICD-10-CM

## 2012-08-21 DIAGNOSIS — Z780 Asymptomatic menopausal state: Secondary | ICD-10-CM | POA: Insufficient documentation

## 2012-08-24 DIAGNOSIS — E039 Hypothyroidism, unspecified: Secondary | ICD-10-CM | POA: Diagnosis not present

## 2012-08-24 DIAGNOSIS — I10 Essential (primary) hypertension: Secondary | ICD-10-CM | POA: Diagnosis not present

## 2012-08-24 DIAGNOSIS — I4949 Other premature depolarization: Secondary | ICD-10-CM | POA: Diagnosis not present

## 2012-11-17 DIAGNOSIS — Z6832 Body mass index (BMI) 32.0-32.9, adult: Secondary | ICD-10-CM | POA: Diagnosis not present

## 2012-11-17 DIAGNOSIS — G8929 Other chronic pain: Secondary | ICD-10-CM | POA: Diagnosis not present

## 2012-11-17 DIAGNOSIS — G589 Mononeuropathy, unspecified: Secondary | ICD-10-CM | POA: Diagnosis not present

## 2012-11-17 DIAGNOSIS — I10 Essential (primary) hypertension: Secondary | ICD-10-CM | POA: Diagnosis not present

## 2012-12-15 DIAGNOSIS — M19079 Primary osteoarthritis, unspecified ankle and foot: Secondary | ICD-10-CM | POA: Diagnosis not present

## 2012-12-15 DIAGNOSIS — Z96659 Presence of unspecified artificial knee joint: Secondary | ICD-10-CM | POA: Diagnosis not present

## 2012-12-15 DIAGNOSIS — Z471 Aftercare following joint replacement surgery: Secondary | ICD-10-CM | POA: Diagnosis not present

## 2012-12-17 DIAGNOSIS — M19079 Primary osteoarthritis, unspecified ankle and foot: Secondary | ICD-10-CM | POA: Diagnosis not present

## 2013-07-02 DIAGNOSIS — H2510 Age-related nuclear cataract, unspecified eye: Secondary | ICD-10-CM | POA: Diagnosis not present

## 2013-07-02 DIAGNOSIS — H52229 Regular astigmatism, unspecified eye: Secondary | ICD-10-CM | POA: Diagnosis not present

## 2013-07-02 DIAGNOSIS — H524 Presbyopia: Secondary | ICD-10-CM | POA: Diagnosis not present

## 2013-07-26 DIAGNOSIS — H6690 Otitis media, unspecified, unspecified ear: Secondary | ICD-10-CM | POA: Diagnosis not present

## 2013-07-26 DIAGNOSIS — H8309 Labyrinthitis, unspecified ear: Secondary | ICD-10-CM | POA: Diagnosis not present

## 2013-07-26 DIAGNOSIS — Z6832 Body mass index (BMI) 32.0-32.9, adult: Secondary | ICD-10-CM | POA: Diagnosis not present

## 2013-07-26 DIAGNOSIS — Z23 Encounter for immunization: Secondary | ICD-10-CM | POA: Diagnosis not present

## 2013-07-26 DIAGNOSIS — I10 Essential (primary) hypertension: Secondary | ICD-10-CM | POA: Diagnosis not present

## 2013-10-20 ENCOUNTER — Other Ambulatory Visit: Payer: Self-pay | Admitting: *Deleted

## 2013-10-20 MED ORDER — METOPROLOL SUCCINATE ER 25 MG PO TB24: 12.5000 mg | ORAL_TABLET | Freq: Every day | ORAL | Status: DC

## 2013-12-21 ENCOUNTER — Other Ambulatory Visit (HOSPITAL_COMMUNITY): Payer: Self-pay | Admitting: Family Medicine

## 2013-12-21 DIAGNOSIS — Z1231 Encounter for screening mammogram for malignant neoplasm of breast: Secondary | ICD-10-CM

## 2013-12-21 DIAGNOSIS — Z01419 Encounter for gynecological examination (general) (routine) without abnormal findings: Secondary | ICD-10-CM | POA: Diagnosis not present

## 2013-12-21 DIAGNOSIS — Z6833 Body mass index (BMI) 33.0-33.9, adult: Secondary | ICD-10-CM | POA: Diagnosis not present

## 2013-12-21 DIAGNOSIS — R5381 Other malaise: Secondary | ICD-10-CM | POA: Diagnosis not present

## 2013-12-21 DIAGNOSIS — Z0000 Encounter for general adult medical examination without abnormal findings: Secondary | ICD-10-CM | POA: Diagnosis not present

## 2013-12-21 DIAGNOSIS — R5383 Other fatigue: Secondary | ICD-10-CM | POA: Diagnosis not present

## 2013-12-21 DIAGNOSIS — G8929 Other chronic pain: Secondary | ICD-10-CM | POA: Diagnosis not present

## 2013-12-27 ENCOUNTER — Ambulatory Visit (HOSPITAL_COMMUNITY)
Admission: RE | Admit: 2013-12-27 | Discharge: 2013-12-27 | Disposition: A | Discharge status: Home / Self Care | Payer: Medicare Other | Source: Ambulatory Visit | Attending: Family Medicine | Admitting: Family Medicine

## 2013-12-27 DIAGNOSIS — Z1231 Encounter for screening mammogram for malignant neoplasm of breast: Secondary | ICD-10-CM | POA: Diagnosis not present

## 2014-04-11 ENCOUNTER — Other Ambulatory Visit (HOSPITAL_COMMUNITY): Payer: Self-pay | Admitting: Family Medicine

## 2014-04-11 ENCOUNTER — Ambulatory Visit (HOSPITAL_COMMUNITY)
Admission: RE | Admit: 2014-04-11 | Discharge: 2014-04-11 | Disposition: A | Discharge status: Home / Self Care | Payer: Medicare Other | Source: Ambulatory Visit | Attending: Family Medicine | Admitting: Family Medicine

## 2014-04-11 DIAGNOSIS — R109 Unspecified abdominal pain: Secondary | ICD-10-CM | POA: Diagnosis not present

## 2014-04-11 DIAGNOSIS — I10 Essential (primary) hypertension: Secondary | ICD-10-CM | POA: Diagnosis not present

## 2014-04-11 DIAGNOSIS — Z6832 Body mass index (BMI) 32.0-32.9, adult: Secondary | ICD-10-CM | POA: Diagnosis not present

## 2014-08-01 DIAGNOSIS — Z23 Encounter for immunization: Secondary | ICD-10-CM | POA: Diagnosis not present

## 2014-08-09 ENCOUNTER — Encounter: Payer: Self-pay | Admitting: Internal Medicine

## 2014-11-11 ENCOUNTER — Other Ambulatory Visit: Payer: Self-pay | Admitting: Internal Medicine

## 2014-11-15 NOTE — Telephone Encounter (Signed)
Rx(s) sent to pharmacy electronically. Left VM for patient that she needed an appointment. Message sent to scheduling pool to contact patient for appointment

## 2014-11-23 ENCOUNTER — Other Ambulatory Visit (HOSPITAL_COMMUNITY): Payer: Self-pay | Admitting: Family Medicine

## 2014-11-23 DIAGNOSIS — E039 Hypothyroidism, unspecified: Secondary | ICD-10-CM | POA: Diagnosis not present

## 2014-11-23 DIAGNOSIS — E785 Hyperlipidemia, unspecified: Secondary | ICD-10-CM | POA: Diagnosis not present

## 2014-11-23 DIAGNOSIS — M1990 Unspecified osteoarthritis, unspecified site: Secondary | ICD-10-CM | POA: Diagnosis not present

## 2014-11-23 DIAGNOSIS — Z23 Encounter for immunization: Secondary | ICD-10-CM | POA: Diagnosis not present

## 2014-11-23 DIAGNOSIS — I10 Essential (primary) hypertension: Secondary | ICD-10-CM | POA: Diagnosis not present

## 2014-11-23 DIAGNOSIS — Z780 Asymptomatic menopausal state: Secondary | ICD-10-CM

## 2014-11-23 DIAGNOSIS — M8580 Other specified disorders of bone density and structure, unspecified site: Secondary | ICD-10-CM

## 2014-11-23 DIAGNOSIS — Z6831 Body mass index (BMI) 31.0-31.9, adult: Secondary | ICD-10-CM | POA: Diagnosis not present

## 2014-11-29 ENCOUNTER — Ambulatory Visit (HOSPITAL_COMMUNITY)
Admission: RE | Admit: 2014-11-29 | Discharge: 2014-11-29 | Disposition: A | Discharge status: Home / Self Care | Payer: Medicare Other | Source: Ambulatory Visit | Attending: Family Medicine | Admitting: Family Medicine

## 2014-11-29 DIAGNOSIS — M8580 Other specified disorders of bone density and structure, unspecified site: Secondary | ICD-10-CM

## 2014-11-29 DIAGNOSIS — M899 Disorder of bone, unspecified: Secondary | ICD-10-CM | POA: Insufficient documentation

## 2014-11-29 DIAGNOSIS — M1990 Unspecified osteoarthritis, unspecified site: Secondary | ICD-10-CM | POA: Diagnosis not present

## 2014-11-29 DIAGNOSIS — M8589 Other specified disorders of bone density and structure, multiple sites: Secondary | ICD-10-CM | POA: Diagnosis not present

## 2015-01-10 ENCOUNTER — Other Ambulatory Visit: Payer: Self-pay | Admitting: Internal Medicine

## 2015-01-10 NOTE — Telephone Encounter (Signed)
E-sent medications

## 2015-05-15 DIAGNOSIS — E6609 Other obesity due to excess calories: Secondary | ICD-10-CM | POA: Diagnosis not present

## 2015-05-15 DIAGNOSIS — I10 Essential (primary) hypertension: Secondary | ICD-10-CM | POA: Diagnosis not present

## 2015-05-15 DIAGNOSIS — E039 Hypothyroidism, unspecified: Secondary | ICD-10-CM | POA: Diagnosis not present

## 2015-05-15 DIAGNOSIS — Z1389 Encounter for screening for other disorder: Secondary | ICD-10-CM | POA: Diagnosis not present

## 2015-05-15 DIAGNOSIS — Z6831 Body mass index (BMI) 31.0-31.9, adult: Secondary | ICD-10-CM | POA: Diagnosis not present

## 2015-06-22 DIAGNOSIS — I10 Essential (primary) hypertension: Secondary | ICD-10-CM | POA: Diagnosis not present

## 2015-06-22 DIAGNOSIS — E039 Hypothyroidism, unspecified: Secondary | ICD-10-CM | POA: Diagnosis not present

## 2015-06-22 DIAGNOSIS — Z6832 Body mass index (BMI) 32.0-32.9, adult: Secondary | ICD-10-CM | POA: Diagnosis not present

## 2015-06-22 DIAGNOSIS — E6609 Other obesity due to excess calories: Secondary | ICD-10-CM | POA: Diagnosis not present

## 2015-06-22 DIAGNOSIS — M79672 Pain in left foot: Secondary | ICD-10-CM | POA: Diagnosis not present

## 2015-06-22 DIAGNOSIS — M109 Gout, unspecified: Secondary | ICD-10-CM | POA: Diagnosis not present

## 2015-06-22 DIAGNOSIS — Z1389 Encounter for screening for other disorder: Secondary | ICD-10-CM | POA: Diagnosis not present

## 2015-07-10 DIAGNOSIS — I10 Essential (primary) hypertension: Secondary | ICD-10-CM | POA: Diagnosis not present

## 2015-07-10 DIAGNOSIS — Z6837 Body mass index (BMI) 37.0-37.9, adult: Secondary | ICD-10-CM | POA: Diagnosis not present

## 2015-07-10 DIAGNOSIS — Z23 Encounter for immunization: Secondary | ICD-10-CM | POA: Diagnosis not present

## 2015-10-06 ENCOUNTER — Encounter (HOSPITAL_COMMUNITY): Payer: Self-pay | Admitting: Emergency Medicine

## 2015-10-06 ENCOUNTER — Emergency Department (HOSPITAL_COMMUNITY): Payer: Medicare Other

## 2015-10-06 ENCOUNTER — Emergency Department (HOSPITAL_COMMUNITY)
Admission: EM | Admit: 2015-10-06 | Discharge: 2015-10-06 | Disposition: A | Discharge status: Home / Self Care | Payer: Medicare Other | Attending: Emergency Medicine | Admitting: Emergency Medicine

## 2015-10-06 DIAGNOSIS — R42 Dizziness and giddiness: Secondary | ICD-10-CM | POA: Insufficient documentation

## 2015-10-06 DIAGNOSIS — R200 Anesthesia of skin: Secondary | ICD-10-CM | POA: Diagnosis not present

## 2015-10-06 DIAGNOSIS — M069 Rheumatoid arthritis, unspecified: Secondary | ICD-10-CM | POA: Diagnosis not present

## 2015-10-06 DIAGNOSIS — R531 Weakness: Secondary | ICD-10-CM | POA: Insufficient documentation

## 2015-10-06 DIAGNOSIS — Z7982 Long term (current) use of aspirin: Secondary | ICD-10-CM | POA: Diagnosis not present

## 2015-10-06 DIAGNOSIS — I440 Atrioventricular block, first degree: Secondary | ICD-10-CM | POA: Diagnosis not present

## 2015-10-06 DIAGNOSIS — Z79899 Other long term (current) drug therapy: Secondary | ICD-10-CM | POA: Diagnosis not present

## 2015-10-06 DIAGNOSIS — Z1389 Encounter for screening for other disorder: Secondary | ICD-10-CM | POA: Diagnosis not present

## 2015-10-06 DIAGNOSIS — Z6838 Body mass index (BMI) 38.0-38.9, adult: Secondary | ICD-10-CM | POA: Diagnosis not present

## 2015-10-06 DIAGNOSIS — Z87891 Personal history of nicotine dependence: Secondary | ICD-10-CM | POA: Diagnosis not present

## 2015-10-06 DIAGNOSIS — R0602 Shortness of breath: Secondary | ICD-10-CM | POA: Diagnosis not present

## 2015-10-06 DIAGNOSIS — Z87738 Personal history of other specified (corrected) congenital malformations of digestive system: Secondary | ICD-10-CM | POA: Insufficient documentation

## 2015-10-06 DIAGNOSIS — I10 Essential (primary) hypertension: Secondary | ICD-10-CM | POA: Insufficient documentation

## 2015-10-06 DIAGNOSIS — Z8719 Personal history of other diseases of the digestive system: Secondary | ICD-10-CM | POA: Diagnosis not present

## 2015-10-06 DIAGNOSIS — R011 Cardiac murmur, unspecified: Secondary | ICD-10-CM | POA: Insufficient documentation

## 2015-10-06 DIAGNOSIS — R110 Nausea: Secondary | ICD-10-CM | POA: Diagnosis not present

## 2015-10-06 DIAGNOSIS — Z8711 Personal history of peptic ulcer disease: Secondary | ICD-10-CM | POA: Diagnosis not present

## 2015-10-06 HISTORY — DX: Essential (primary) hypertension: I10

## 2015-10-06 LAB — CBC WITH DIFFERENTIAL/PLATELET
BASOS PCT: 1 %
Basophils Absolute: 0 10*3/uL (ref 0.0–0.1)
EOS ABS: 0.2 10*3/uL (ref 0.0–0.7)
Eosinophils Relative: 3 %
HCT: 36.2 % (ref 36.0–46.0)
HEMOGLOBIN: 12.3 g/dL (ref 12.0–15.0)
Lymphocytes Relative: 33 %
Lymphs Abs: 1.7 10*3/uL (ref 0.7–4.0)
MCH: 30.1 pg (ref 26.0–34.0)
MCHC: 34 g/dL (ref 30.0–36.0)
MCV: 88.5 fL (ref 78.0–100.0)
MONO ABS: 0.5 10*3/uL (ref 0.1–1.0)
MONOS PCT: 9 %
NEUTROS PCT: 54 %
Neutro Abs: 2.9 10*3/uL (ref 1.7–7.7)
Platelets: 173 10*3/uL (ref 150–400)
RBC: 4.09 MIL/uL (ref 3.87–5.11)
RDW: 13.8 % (ref 11.5–15.5)
WBC: 5.3 10*3/uL (ref 4.0–10.5)

## 2015-10-06 LAB — URINALYSIS, ROUTINE W REFLEX MICROSCOPIC
BILIRUBIN URINE: NEGATIVE
GLUCOSE, UA: NEGATIVE mg/dL
Hgb urine dipstick: NEGATIVE
KETONES UR: NEGATIVE mg/dL
Leukocytes, UA: NEGATIVE
Nitrite: NEGATIVE
PH: 7 (ref 5.0–8.0)
Protein, ur: NEGATIVE mg/dL
Specific Gravity, Urine: 1.005 (ref 1.005–1.030)

## 2015-10-06 LAB — COMPREHENSIVE METABOLIC PANEL
ALBUMIN: 4.2 g/dL (ref 3.5–5.0)
ALT: 15 U/L (ref 14–54)
AST: 25 U/L (ref 15–41)
Alkaline Phosphatase: 63 U/L (ref 38–126)
Anion gap: 9 (ref 5–15)
BUN: 12 mg/dL (ref 6–20)
CALCIUM: 9.4 mg/dL (ref 8.9–10.3)
CO2: 25 mmol/L (ref 22–32)
CREATININE: 0.92 mg/dL (ref 0.44–1.00)
Chloride: 102 mmol/L (ref 101–111)
GFR calc Af Amer: >60 mL/min (ref >60)
GFR calc non Af Amer: 59 mL/min — ABNORMAL LOW (ref >60)
GLUCOSE: 91 mg/dL (ref 65–99)
Potassium: 4 mmol/L (ref 3.5–5.1)
SODIUM: 136 mmol/L (ref 135–145)
TOTAL PROTEIN: 7.3 g/dL (ref 6.5–8.1)
Total Bilirubin: 0.5 mg/dL (ref 0.3–1.2)

## 2015-10-06 LAB — TROPONIN I
Troponin I: <0.03 ng/mL (ref <0.031)
Troponin I: <0.03 ng/mL (ref <0.031)

## 2015-10-06 MED ORDER — MECLIZINE HCL 12.5 MG PO TABS
25.0000 mg | ORAL_TABLET | Freq: Once | ORAL | Status: AC
  Administered 2015-10-06: 25 mg via ORAL
  Filled 2015-10-06: qty 2

## 2015-10-06 MED ORDER — DIAZEPAM 2 MG PO TABS
2.0000 mg | ORAL_TABLET | Freq: Once | ORAL | Status: DC
  Filled 2015-10-06: qty 1

## 2015-10-06 NOTE — Discharge Instructions (Signed)
Dizziness Your MRI shows an old stroke but no new strokes. Take a baby aspirin daily. Follow up with your doctor and the cardiologist for a stress test. Return to the ED if you develop new or worsening symptoms. Dizziness is a common problem. It is a feeling of unsteadiness or light-headedness. You may feel like you are about to faint. Dizziness can lead to injury if you stumble or fall. Anyone can become dizzy, but dizziness is more common in older adults. This condition can be caused by a number of things, including medicines, dehydration, or illness. HOME CARE INSTRUCTIONS Taking these steps may help with your condition: Eating and Drinking  Drink enough fluid to keep your urine clear or pale yellow. This helps to keep you from becoming dehydrated. Try to drink more clear fluids, such as water.  Do not drink alcohol.  Limit your caffeine intake if directed by your health care provider.  Limit your salt intake if directed by your health care provider. Activity  Avoid making quick movements.  Rise slowly from chairs and steady yourself until you feel okay.  In the morning, first sit up on the side of the bed. When you feel okay, stand slowly while you hold onto something until you know that your balance is fine.  Move your legs often if you need to stand in one place for a long time. Tighten and relax your muscles in your legs while you are standing.  Do not drive or operate heavy machinery if you feel dizzy.  Avoid bending down if you feel dizzy. Place items in your home so that they are easy for you to reach without leaning over. Lifestyle  Do not use any tobacco products, including cigarettes, chewing tobacco, or electronic cigarettes. If you need help quitting, ask your health care provider.  Try to reduce your stress level, such as with yoga or meditation. Talk with your health care provider if you need help. General Instructions  Watch your dizziness for any changes.  Take  medicines only as directed by your health care provider. Talk with your health care provider if you think that your dizziness is caused by a medicine that you are taking.  Tell a friend or a family member that you are feeling dizzy. If he or she notices any changes in your behavior, have this person call your health care provider.  Keep all follow-up visits as directed by your health care provider. This is important. SEEK MEDICAL CARE IF:  Your dizziness does not go away.  Your dizziness or light-headedness gets worse.  You feel nauseous.  You have reduced hearing.  You have new symptoms.  You are unsteady on your feet or you feel like the room is spinning. SEEK IMMEDIATE MEDICAL CARE IF:  You vomit or have diarrhea and are unable to eat or drink anything.  You have problems talking, walking, swallowing, or using your arms, hands, or legs.  You feel generally weak.  You are not thinking clearly or you have trouble forming sentences. It may take a friend or family member to notice this.  You have chest pain, abdominal pain, shortness of breath, or sweating.  Your vision changes.  You notice any bleeding.  You have a headache.  You have neck pain or a stiff neck.  You have a fever.   This information is not intended to replace advice given to you by your health care provider. Make sure you discuss any questions you have with your health care  provider.   Document Released: 04/09/2001 Document Revised: 02/28/2015 Document Reviewed: 10/10/2014 Elsevier Interactive Patient Education [employer].

## 2015-10-06 NOTE — ED Notes (Signed)
Pt in MRI.

## 2015-10-06 NOTE — ED Notes (Signed)
MD at bedside. 

## 2015-10-06 NOTE — ED Notes (Addendum)
Pt sent from [place] MD reported that pt has c/o of fatigue,dyspnea with exertion, dizziness with ambulation since last Thursday. MD at [place] office reported that pt has developed 1st degree heart block since last visit in August 2016. Pt alert and oriented. Speech clear. Able to move all extremities. Pt reports intermittent palpitations,denies chest pain, sob at rest.

## 2015-10-06 NOTE — ED Provider Notes (Signed)
CSN: [id number]     Arrival date & time 10/06/15  0915 History  By signing my name below, I, Terressa Koyanagi, attest that this documentation has been prepared under the direction and in the presence of No att. providers found. Electronically Signed: Terressa Koyanagi, ED Scribe. 10/07/2015. 9:41 AM.  Chief Complaint  Patient presents with  . Dizziness   The history is provided by the patient. No language interpreter was used.   PCP: Rocky Morel, MD HPI Comments: Loretta Hunt is a 74 y.o. female who presents to the Emergency Department at the direction of [facility], with PMHx noted below, complaining of intermittent dizziness (spinning sensation) with associated lightheadedness onset 2 weeks ago. Pt reports the Sx are worse with changing position-- pt states "I feel like I am falling up" and "I feel drunk but haven't been drinking." Pt denies Hx of DM or heart problems. Pt confirm mild intermittent numbness of right hand, however, denies HA, chest pain, n/v, change in vision, appetite changes, weakness, abd pain, SOB, recent fever, recent fall.   Past Medical History  Diagnosis Date  . Rheumatoid arthritis(714.0)        . Hx of adenomatous colonic polyps   . GERD (gastroesophageal reflux disease)   . PUD (peptic ulcer disease) 1994    h.pylori  . Schatzki's ring 2004    s/p dilation  . Heart murmur   . Hypertension    Past Surgical History  Procedure Laterality Date  . Knee replacement surgery      bilateral, 1994/1995  . Foot surgery      left  . Colonoscopy  08/2006    Anal papilloma, internal hemorrhoids. Recommended to have repeat colonoscopy in November 2012  . Colonoscopy  2004    Melanosis coli (mild), couple of scattered sigmoid diverticula, small polyps and a descending colon. Adenomatous polyps.  . Esophagogastroduodenoscopy  2004    Mild ulcerative reflux esophagitis, Schatzki ring dilated and disrupted, small hiatal hernia  . Colonoscopy  08/26/2011     Procedure: COLONOSCOPY;  Surgeon: Daneil Dolin, MD;  Location: [facility];  Service: Endoscopy;  Laterality: N/A;  8:30  . Abdominal hysterectomy     Family History  Problem Relation Age of Onset  . Stomach cancer Father   . Colon cancer Neg Hx    Social History  Substance Use Topics  . Smoking status: Former Smoker -- 0.50 packs/day    Types: Cigarettes  . Smokeless tobacco: Former Systems developer    Quit date: 12/11/1992  . Alcohol Use: Yes     Comment: a pint a week. None since 1994.   OB History    No data available     Review of Systems  A complete 10 system review of systems was obtained and all systems are negative except as noted in the HPI and PMH.   Allergies  Review of patient's allergies indicates no known allergies.  Home Medications   Prior to Admission medications   Medication Sig Start Date End Date Taking? Authorizing Provider  Ascorbic Acid (VITAMIN C) 1000 MG tablet Take 1,000 mg by mouth daily.     Yes Historical Provider, MD  aspirin 81 MG tablet Take 81 mg by mouth daily.     Yes Historical Provider, MD  Cholecalciferol (VITAMIN D3) 5000 UNITS TABS Take 1 tablet by mouth daily.   Yes Historical Provider, MD  fish oil-omega-3 fatty acids 1000 MG capsule Take 1 g by mouth daily.    Yes  Historical Provider, MD  furosemide (LASIX) 20 MG tablet Take 20 mg by mouth daily as needed for fluid or edema.   Yes Historical Provider, MD  levothyroxine (SYNTHROID, LEVOTHROID) 100 MCG tablet Take 100 mcg by mouth daily.  07/08/11  Yes Historical Provider, MD  lisinopril (PRINIVIL,ZESTRIL) 20 MG tablet Take 10 mg by mouth daily.    Yes Historical Provider, MD  meloxicam (MOBIC) 7.5 MG tablet Take 7.5 mg by mouth daily as needed. arthritis   Yes Historical Provider, MD  metoprolol succinate (TOPROL-XL) 25 MG 24 hr tablet take 1/2 once daily 01/10/15  Yes Pixie Casino, MD  Multiple Vitamins-Minerals (MULTIVITAMINS THER. W/MINERALS) TABS Take 1 tablet by mouth daily.    Yes  Historical Provider, MD   Triage Vitals: BP 178/74 mmHg  Pulse 53  Resp 18  Ht 5' 11.5" (1.816 m)  Wt 209 lb (94.802 kg)  BMI 28.75 kg/m2 Physical Exam  Constitutional: She is oriented to person, place, and time. She appears well-developed and well-nourished. No distress.  HENT:  Head: Normocephalic and atraumatic.  Mouth/Throat: Oropharynx is clear and moist. No oropharyngeal exudate.  Eyes: Conjunctivae and EOM are normal. Pupils are equal, round, and reactive to light. Right eye exhibits no nystagmus. Left eye exhibits no nystagmus.  Neck: Normal range of motion. Neck supple.  No meningismus.  Cardiovascular: Normal rate, regular rhythm, normal heart sounds and intact distal pulses.   No murmur heard. Pulmonary/Chest: Effort normal and breath sounds normal. No respiratory distress.  Abdominal: Soft. There is no tenderness. There is no rebound and no guarding.  Musculoskeletal: Normal range of motion. She exhibits no edema or tenderness.  Neurological: She is alert and oriented to person, place, and time. No cranial nerve deficit. She exhibits normal muscle tone. Coordination normal.  No ataxia on finger to nose bilaterally. No pronator drift. 5/5 strength throughout. CN 2-12 intact.Equal grip strength. Sensation intact. Test of skew negative. No nystagmus. Head impulse testing negative. Romberg negative. Gait is cautious but steady  Skin: Skin is warm.  Psychiatric: She has a normal mood and affect. Her behavior is normal.  Nursing note and vitals reviewed.   ED Course  Procedures (including critical care time) COORDINATION OF CARE: 9:37 AM: Discussed treatment plan which includes labs and MRI with pt at bedside; patient verbalizes understanding and agrees with treatment plan. 11:48 AM: Recheck, pt resting comfortably.   Labs Review Labs Reviewed  COMPREHENSIVE METABOLIC PANEL - Abnormal; Notable for the following:    GFR calc non Af Amer 59 (*)    All other components within  normal limits  CBC WITH DIFFERENTIAL/PLATELET  TROPONIN I  URINALYSIS, ROUTINE W REFLEX MICROSCOPIC (NOT AT [facility])  TROPONIN I    Imaging Review Mr Brain Wo Contrast  10/06/2015  CLINICAL DATA:  Vertigo.  Dizziness for the past couple of weeks. EXAM: MRI HEAD WITHOUT CONTRAST TECHNIQUE: Multiplanar, multiecho pulse sequences of the brain and surrounding structures were obtained without intravenous contrast. COMPARISON:  None. FINDINGS: There is no evidence of acute infarct, intracranial hemorrhage, intra-axial mass, midline shift, or extra-axial fluid collection. Mild generalized cerebral atrophy is within normal limits for age. There are small, chronic cerebellar infarcts bilaterally. Periventricular white matter T2 hyperintensities are nonspecific but compatible with mild chronic small vessel ischemic disease. A 9 mm extra-axial mass overlies the left frontal lobe without mass effect or edema. Orbits are unremarkable. The right sphenoid sinus and posterior right ethmoid air cells are completely opacified by T1 hyperintense, T2 hypointense material.  Mastoid air cells are clear. Major intracranial vascular flow voids are preserved. IMPRESSION: 1. No acute intracranial abnormality. 2. Chronic cerebellar infarcts. 3. 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 4. Completely opacified posterior right ethmoid and right sphenoid sinuses. This may reflect chronic sinusitis with proteinaceous material and/or allergic fungal colonization. Underlying mucocele not excluded. Electronically Signed   By: Logan Bores M.D.   On: 10/06/2015 11:15   I have personally reviewed and evaluated these images and lab results as part of my medical decision-making.   EKG Interpretation   Date/Time:  Friday October 06 2015 09:26:32 EST Ventricular Rate:  57 PR Interval:  188 QRS Duration: 98 QT Interval:  426 QTC Calculation: 415 R Axis:   -16 Text Interpretation:  Sinus or ectopic atrial  rhythm Left ventricular  hypertrophy Baseline wander in lead(s) II No previous ECGs available  Confirmed by Murray Guzzetta  MD, Cambry Spampinato ([id number]) on 10/06/2015 9:42:31 AM      MDM   Final diagnoses:  Vertigo   2 weeks of intermittent dizziness worse with standing. Associated with some nausea. No vomiting.. She has generalized weakness as well. No chest pain.  Nonfocal neurological exam. No nystagmus. No ataxia.  MRI is negative for acute infarct. There is evidence of old cerebellar infarcts. There is also evidence of sinus disease and meningioma.   troponin negative 2. Patient able to ambulate. She has no further dizziness after meclizine. No chest pain or shortness of breath.  MRI is negative for acute infarct. She is informed of her old infarcts and meningioma. Continue ASA.  Symptoms are controlled. Patient is ambulatory and anxious to be discharged. She will follow-up with her PCP, return precautions discussed  I personally performed the services described in this documentation, which was scribed in my presence. The recorded information has been reviewed and is accurate.    Ezequiel Essex, MD 10/07/15 [phone]

## 2015-10-06 NOTE — ED Notes (Signed)
Pt ambulated to BR with mild dizziness at first, gait was steady

## 2015-10-10 ENCOUNTER — Other Ambulatory Visit (HOSPITAL_COMMUNITY): Payer: Self-pay | Admitting: Family Medicine

## 2015-10-10 ENCOUNTER — Ambulatory Visit (HOSPITAL_COMMUNITY)
Admission: RE | Admit: 2015-10-10 | Discharge: 2015-10-10 | Disposition: A | Discharge status: Home / Self Care | Payer: Medicare Other | Source: Ambulatory Visit | Attending: Family Medicine | Admitting: Family Medicine

## 2015-10-10 DIAGNOSIS — R002 Palpitations: Secondary | ICD-10-CM | POA: Diagnosis not present

## 2015-10-10 DIAGNOSIS — R059 Cough, unspecified: Secondary | ICD-10-CM

## 2015-10-10 DIAGNOSIS — R05 Cough: Secondary | ICD-10-CM | POA: Diagnosis not present

## 2015-10-10 DIAGNOSIS — R42 Dizziness and giddiness: Secondary | ICD-10-CM | POA: Diagnosis not present

## 2015-10-10 DIAGNOSIS — Z6838 Body mass index (BMI) 38.0-38.9, adult: Secondary | ICD-10-CM | POA: Diagnosis not present

## 2015-11-13 DIAGNOSIS — I639 Cerebral infarction, unspecified: Secondary | ICD-10-CM | POA: Diagnosis not present

## 2015-11-13 DIAGNOSIS — R42 Dizziness and giddiness: Secondary | ICD-10-CM | POA: Diagnosis not present

## 2015-11-13 DIAGNOSIS — I10 Essential (primary) hypertension: Secondary | ICD-10-CM | POA: Diagnosis not present

## 2015-11-13 DIAGNOSIS — R002 Palpitations: Secondary | ICD-10-CM | POA: Diagnosis not present

## 2015-11-13 DIAGNOSIS — R9431 Abnormal electrocardiogram [ECG] [EKG]: Secondary | ICD-10-CM | POA: Diagnosis not present

## 2015-11-17 DIAGNOSIS — I442 Atrioventricular block, complete: Secondary | ICD-10-CM | POA: Diagnosis not present

## 2015-11-17 DIAGNOSIS — I471 Supraventricular tachycardia: Secondary | ICD-10-CM | POA: Diagnosis not present

## 2015-11-17 DIAGNOSIS — I498 Other specified cardiac arrhythmias: Secondary | ICD-10-CM | POA: Diagnosis not present

## 2015-11-20 DIAGNOSIS — R002 Palpitations: Secondary | ICD-10-CM | POA: Diagnosis not present

## 2015-11-21 DIAGNOSIS — I498 Other specified cardiac arrhythmias: Secondary | ICD-10-CM | POA: Diagnosis not present

## 2015-11-21 DIAGNOSIS — I442 Atrioventricular block, complete: Secondary | ICD-10-CM | POA: Diagnosis not present

## 2015-11-21 DIAGNOSIS — I471 Supraventricular tachycardia: Secondary | ICD-10-CM | POA: Diagnosis not present

## 2015-11-21 DIAGNOSIS — R002 Palpitations: Secondary | ICD-10-CM | POA: Diagnosis not present

## 2015-11-21 DIAGNOSIS — R42 Dizziness and giddiness: Secondary | ICD-10-CM | POA: Diagnosis not present

## 2015-11-21 DIAGNOSIS — I639 Cerebral infarction, unspecified: Secondary | ICD-10-CM | POA: Diagnosis not present

## 2015-11-21 DIAGNOSIS — I638 Other cerebral infarction: Secondary | ICD-10-CM | POA: Diagnosis not present

## 2015-11-21 DIAGNOSIS — I10 Essential (primary) hypertension: Secondary | ICD-10-CM | POA: Diagnosis not present

## 2015-12-15 ENCOUNTER — Other Ambulatory Visit (HOSPITAL_COMMUNITY): Payer: Self-pay | Admitting: Family Medicine

## 2015-12-15 DIAGNOSIS — Z1231 Encounter for screening mammogram for malignant neoplasm of breast: Secondary | ICD-10-CM

## 2015-12-18 ENCOUNTER — Ambulatory Visit (HOSPITAL_COMMUNITY)
Admission: RE | Admit: 2015-12-18 | Discharge: 2015-12-18 | Disposition: A | Discharge status: Home / Self Care | Payer: Medicare Other | Source: Ambulatory Visit | Attending: Family Medicine | Admitting: Family Medicine

## 2015-12-18 DIAGNOSIS — Z1231 Encounter for screening mammogram for malignant neoplasm of breast: Secondary | ICD-10-CM | POA: Diagnosis not present

## 2015-12-28 DIAGNOSIS — H5203 Hypermetropia, bilateral: Secondary | ICD-10-CM | POA: Diagnosis not present

## 2015-12-28 DIAGNOSIS — H2513 Age-related nuclear cataract, bilateral: Secondary | ICD-10-CM | POA: Diagnosis not present

## 2015-12-28 DIAGNOSIS — H524 Presbyopia: Secondary | ICD-10-CM | POA: Diagnosis not present

## 2015-12-28 DIAGNOSIS — H52223 Regular astigmatism, bilateral: Secondary | ICD-10-CM | POA: Diagnosis not present

## 2016-01-22 DIAGNOSIS — H35363 Drusen (degenerative) of macula, bilateral: Secondary | ICD-10-CM | POA: Diagnosis not present

## 2016-01-22 DIAGNOSIS — H5203 Hypermetropia, bilateral: Secondary | ICD-10-CM | POA: Diagnosis not present

## 2016-01-22 DIAGNOSIS — H25811 Combined forms of age-related cataract, right eye: Secondary | ICD-10-CM | POA: Diagnosis not present

## 2016-01-22 DIAGNOSIS — H25813 Combined forms of age-related cataract, bilateral: Secondary | ICD-10-CM | POA: Diagnosis not present

## 2016-01-24 NOTE — Patient Instructions (Signed)
Your procedure is scheduled on:  02/05/2016               Report to [facility] at 10:00    AM.  Call this number if you have problems the morning of surgery: [phone]   Remember:   Do not eat or drink :After Midnight.    Take these medicines the morning of surgery with A SIP OF WATER:  Synthroid, lisinopril,and metoprolol          Do not wear jewelry, make-up or nail polish.  Do not wear lotions, powders, or perfumes. You may wear deodorant.  Do not bring valuables to the hospital.  Contacts, dentures or bridgework may not be worn into surgery.  Patients discharged the day of surgery will not be allowed to drive home.  Name and phone number of your driver:    @10RELATIVEDAYS @ Cataract Surgery  A cataract is a clouding of the lens of the eye. When a lens becomes cloudy, vision is reduced based on the degree and nature of the clouding. Surgery may be needed to improve vision. Surgery removes the cloudy lens and usually replaces it with a substitute lens (intraocular lens, IOL). LET YOUR EYE DOCTOR KNOW ABOUT:  Allergies to food or medicine.   Medicines taken including herbs, eyedrops, over-the-counter medicines, and creams.   Use of steroids (by mouth or creams).   Previous problems with anesthetics or numbing medicine.   History of bleeding problems or blood clots.   Previous surgery.   Other health problems, including diabetes and kidney problems.   Possibility of pregnancy, if this applies.  RISKS AND COMPLICATIONS  Infection.   Inflammation of the eyeball (endophthalmitis) that can spread to both eyes (sympathetic ophthalmia).   Poor wound healing.   If an IOL is inserted, it can later fall out of proper position. This is very uncommon.   Clouding of the part of your eye that holds an IOL in place. This is called an "after-cataract." These are uncommon, but easily treated.  BEFORE THE PROCEDURE  Do not eat or drink anything except small amounts of water for  8 to 12 before your surgery, or as directed by your caregiver.   Unless you are told otherwise, continue any eyedrops you have been prescribed.   Talk to your primary caregiver about all other medicines that you take (both prescription and non-prescription). In some cases, you may need to stop or change medicines near the time of your surgery. This is most important if you are taking blood-thinning medicine.Do not stop medicines unless you are told to do so.   Arrange for someone to drive you to and from the procedure.   Do not put contact lenses in either eye on the day of your surgery.  PROCEDURE There is more than one method for safely removing a cataract. Your doctor can explain the differences and help determine which is best for you. Phacoemulsification surgery is the most common form of cataract surgery.  An injection is given behind the eye or eyedrops are given to make this a painless procedure.   A small cut (incision) is made on the edge of the clear, dome-shaped surface that covers the front of the eye (cornea).   A tiny probe is painlessly inserted into the eye. This device gives off ultrasound waves that soften and break up the cloudy center of the lens. This makes it easier for the cloudy lens to be removed by suction.   An IOL may  be implanted.   The normal lens of the eye is covered by a clear capsule. Part of that capsule is intentionally left in the eye to support the IOL.   Your surgeon may or may not use stitches to close the incision.  There are other forms of cataract surgery that require a larger incision and stiches to close the eye. This approach is taken in cases where the doctor feels that the cataract cannot be easily removed using phacoemulsification. AFTER THE PROCEDURE  When an IOL is implanted, it does not need care. It becomes a permanent part of your eye and cannot be seen or felt.   Your doctor will schedule follow-up exams to check on your progress.    Review your other medicines with your doctor to see which can be resumed after surgery.   Use eyedrops or take medicine as prescribed by your doctor.  Document Released: 10/03/2011 Document Reviewed: 09/30/2011 [facility] Patient Information 2012 [place].  .Cataract Surgery Care After Refer to this sheet in the next few weeks. These instructions provide you with information on caring for yourself after your procedure. Your caregiver may also give you more specific instructions. Your treatment has been planned according to current medical practices, but problems sometimes occur. Call your caregiver if you have any problems or questions after your procedure.  HOME CARE INSTRUCTIONS   Avoid strenuous activities as directed by your caregiver.   Ask your caregiver when you can resume driving.   Use eyedrops or other medicines to help healing and control pressure inside your eye as directed by your caregiver.   Only take over-the-counter or prescription medicines for pain, discomfort, or fever as directed by your caregiver.   Do not to touch or rub your eyes.   You may be instructed to use a protective shield during the first few days and nights after surgery. If not, wear sunglasses to protect your eyes. This is to protect the eye from pressure or from being accidentally bumped.   Keep the area around your eye clean and dry. Avoid swimming or allowing water to hit you directly in the face while showering. Keep soap and shampoo out of your eyes.   Do not bend or lift heavy objects. Bending increases pressure in the eye. You can walk, climb stairs, and do light household chores.   Do not put a contact lens into the eye that had surgery until your caregiver says it is okay to do so.   Ask your doctor when you can return to work. This will depend on the kind of work that you do. If you work in a dusty environment, you may be advised to wear protective eyewear for a period of time.    Ask your caregiver when it will be safe to engage in sexual activity.   Continue with your regular eye exams as directed by your caregiver.  What to expect:  It is normal to feel itching and mild discomfort for a few days after cataract surgery. Some fluid discharge is also common, and your eye may be sensitive to light and touch.   After 1 to 2 days, even moderate discomfort should disappear. In most cases, healing will take about 6 weeks.   If you received an intraocular lens (IOL), you may notice that colors are very bright or have a blue tinge. Also, if you have been in bright sunlight, everything may appear reddish for a few hours. If you see these color tinges, it is  because your lens is clear and no longer cloudy. Within a few months after receiving an IOL, these extra colors should go away. When you have healed, you will probably need new glasses.  SEEK MEDICAL CARE IF:   You have increased bruising around your eye.   You have discomfort not helped by medicine.  SEEK IMMEDIATE MEDICAL CARE IF:   You have a fever.   You have a worsening or sudden vision loss.   You have redness, swelling, or increasing pain in the eye.   You have a thick discharge from the eye that had surgery.  MAKE SURE YOU:  Understand these instructions.   Will watch your condition.   Will get help right away if you are not doing well or get worse.  Document Released: 05/03/2005 Document Revised: 10/03/2011 Document Reviewed: 06/07/2011 [facility] Patient Information 2012 [place].    Monitored Anesthesia Care  Monitored anesthesia care is an anesthesia service for a medical procedure. Anesthesia is the loss of the ability to feel pain. It is produced by medications called anesthetics. It may affect a small area of your body (local anesthesia), a large area of your body (regional anesthesia), or your entire body (general anesthesia). The need for monitored anesthesia care depends your  procedure, your condition, and the potential need for regional or general anesthesia. It is often provided during procedures where:   General anesthesia may be needed if there are complications. This is because you need special care when you are under general anesthesia.   You will be under local or regional anesthesia. This is so that you are able to have higher levels of anesthesia if needed.   You will receive calming medications (sedatives). This is especially the case if sedatives are given to put you in a semi-conscious state of relaxation (deep sedation). This is because the amount of sedative needed to produce this state can be hard to predict. Too much of a sedative can produce general anesthesia. Monitored anesthesia care is performed by one or more caregivers who have special training in all types of anesthesia. You will need to meet with these caregivers before your procedure. During this meeting, they will ask you about your medical history. They will also give you instructions to follow. (For example, you will need to stop eating and drinking before your procedure. You may also need to stop or change medications you are taking.) During your procedure, your caregivers will stay with you. They will:   Watch your condition. This includes watching you blood pressure, breathing, and level of pain.   Diagnose and treat problems that occur.   Give medications if they are needed. These may include calming medications (sedatives) and anesthetics.   Make sure you are comfortable.  Having monitored anesthesia care does not necessarily mean that you will be under anesthesia. It does mean that your caregivers will be able to manage anesthesia if you need it or if it occurs. It also means that you will be able to have a different type of anesthesia than you are having if you need it. When your procedure is complete, your caregivers will continue to watch your condition. They will make sure any  medications wear off before you are allowed to go home.  Document Released: 07/10/2005 Document Revised: 02/08/2013 Document Reviewed: 11/25/2012 [facility] Patient Information 2014 [place], [place].

## 2016-01-26 ENCOUNTER — Encounter (HOSPITAL_COMMUNITY)
Admission: RE | Admit: 2016-01-26 | Discharge: 2016-01-26 | Disposition: A | Discharge status: Home / Self Care | Payer: Medicare Other | Source: Ambulatory Visit | Attending: Ophthalmology | Admitting: Ophthalmology

## 2016-01-26 ENCOUNTER — Encounter (HOSPITAL_COMMUNITY): Payer: Self-pay

## 2016-01-26 DIAGNOSIS — Z01812 Encounter for preprocedural laboratory examination: Secondary | ICD-10-CM | POA: Insufficient documentation

## 2016-01-26 DIAGNOSIS — Z0000 Encounter for general adult medical examination without abnormal findings: Secondary | ICD-10-CM | POA: Diagnosis not present

## 2016-01-26 DIAGNOSIS — E6609 Other obesity due to excess calories: Secondary | ICD-10-CM | POA: Diagnosis not present

## 2016-01-26 DIAGNOSIS — Z6830 Body mass index (BMI) 30.0-30.9, adult: Secondary | ICD-10-CM | POA: Diagnosis not present

## 2016-01-26 DIAGNOSIS — Z1389 Encounter for screening for other disorder: Secondary | ICD-10-CM | POA: Diagnosis not present

## 2016-01-26 HISTORY — DX: Hypothyroidism, unspecified: E03.9

## 2016-01-26 LAB — BASIC METABOLIC PANEL
Anion gap: 9 (ref 5–15)
BUN: 15 mg/dL (ref 6–20)
CALCIUM: 9 mg/dL (ref 8.9–10.3)
CO2: 23 mmol/L (ref 22–32)
CREATININE: 1.03 mg/dL — AB (ref 0.44–1.00)
Chloride: 106 mmol/L (ref 101–111)
GFR calc Af Amer: 59 mL/min — ABNORMAL LOW (ref >60)
GFR, EST NON AFRICAN AMERICAN: 51 mL/min — AB (ref >60)
GLUCOSE: 89 mg/dL (ref 65–99)
POTASSIUM: 4 mmol/L (ref 3.5–5.1)
Sodium: 138 mmol/L (ref 135–145)

## 2016-01-26 LAB — CBC
HCT: 36.5 % (ref 36.0–46.0)
Hemoglobin: 12.2 g/dL (ref 12.0–15.0)
MCH: 29.6 pg (ref 26.0–34.0)
MCHC: 33.4 g/dL (ref 30.0–36.0)
MCV: 88.6 fL (ref 78.0–100.0)
PLATELETS: 183 10*3/uL (ref 150–400)
RBC: 4.12 MIL/uL (ref 3.87–5.11)
RDW: 14.1 % (ref 11.5–15.5)
WBC: 6.6 10*3/uL (ref 4.0–10.5)

## 2016-01-26 NOTE — Pre-Procedure Instructions (Signed)
Patient given information to sign up for my chart at home. 

## 2016-02-05 ENCOUNTER — Encounter (HOSPITAL_COMMUNITY): Payer: Self-pay | Admitting: Anesthesiology

## 2016-02-05 ENCOUNTER — Ambulatory Visit (HOSPITAL_COMMUNITY)
Admission: RE | Admit: 2016-02-05 | Discharge: 2016-02-05 | Disposition: A | Discharge status: Home / Self Care | Payer: Medicare Other | Source: Ambulatory Visit | Attending: Ophthalmology | Admitting: Ophthalmology

## 2016-02-05 ENCOUNTER — Encounter (HOSPITAL_COMMUNITY)
Admission: RE | Disposition: A | Discharge status: Home / Self Care | Payer: Self-pay | Source: Ambulatory Visit | Attending: Ophthalmology

## 2016-02-05 ENCOUNTER — Ambulatory Visit (HOSPITAL_COMMUNITY): Payer: Medicare Other | Admitting: Anesthesiology

## 2016-02-05 DIAGNOSIS — K219 Gastro-esophageal reflux disease without esophagitis: Secondary | ICD-10-CM | POA: Diagnosis not present

## 2016-02-05 DIAGNOSIS — Z87891 Personal history of nicotine dependence: Secondary | ICD-10-CM | POA: Diagnosis not present

## 2016-02-05 DIAGNOSIS — E039 Hypothyroidism, unspecified: Secondary | ICD-10-CM | POA: Insufficient documentation

## 2016-02-05 DIAGNOSIS — I10 Essential (primary) hypertension: Secondary | ICD-10-CM | POA: Insufficient documentation

## 2016-02-05 DIAGNOSIS — H269 Unspecified cataract: Secondary | ICD-10-CM | POA: Diagnosis not present

## 2016-02-05 DIAGNOSIS — Z79899 Other long term (current) drug therapy: Secondary | ICD-10-CM | POA: Insufficient documentation

## 2016-02-05 DIAGNOSIS — H25811 Combined forms of age-related cataract, right eye: Secondary | ICD-10-CM | POA: Diagnosis not present

## 2016-02-05 DIAGNOSIS — Z7982 Long term (current) use of aspirin: Secondary | ICD-10-CM | POA: Insufficient documentation

## 2016-02-05 HISTORY — PX: CATARACT EXTRACTION W/PHACO: SHX586

## 2016-02-05 SURGERY — PHACOEMULSIFICATION, CATARACT, WITH IOL INSERTION
Anesthesia: Monitor Anesthesia Care | Site: Eye | Laterality: Right

## 2016-02-05 MED ORDER — LIDOCAINE 3.5 % OP GEL OPTIME - NO CHARGE
OPHTHALMIC | Status: DC | PRN
  Administered 2016-02-05: 1 [drp] via OPHTHALMIC

## 2016-02-05 MED ORDER — PHENYLEPHRINE HCL 2.5 % OP SOLN
1.0000 [drp] | OPHTHALMIC | Status: AC
  Administered 2016-02-05 (×3): 1 [drp] via OPHTHALMIC

## 2016-02-05 MED ORDER — LACTATED RINGERS IV SOLN: INTRAVENOUS | Status: DC

## 2016-02-05 MED ORDER — EPINEPHRINE HCL 1 MG/ML IJ SOLN
INTRAMUSCULAR | Status: AC
  Filled 2016-02-05: qty 1

## 2016-02-05 MED ORDER — LIDOCAINE HCL 3.5 % OP GEL
1.0000 "application " | Freq: Once | OPHTHALMIC | Status: AC
  Administered 2016-02-05: 1 via OPHTHALMIC

## 2016-02-05 MED ORDER — LIDOCAINE HCL (PF) 1 % IJ SOLN
INTRAMUSCULAR | Status: DC | PRN
  Administered 2016-02-05: .5 mL

## 2016-02-05 MED ORDER — PROVISC 10 MG/ML IO SOLN
INTRAOCULAR | Status: DC | PRN
  Administered 2016-02-05: 0.85 mL via INTRAOCULAR

## 2016-02-05 MED ORDER — BSS IO SOLN
INTRAOCULAR | Status: DC | PRN
  Administered 2016-02-05: 15 mL

## 2016-02-05 MED ORDER — LACTATED RINGERS IV SOLN
INTRAVENOUS | Status: DC | PRN
  Administered 2016-02-05: 10:00:00 via INTRAVENOUS

## 2016-02-05 MED ORDER — FENTANYL CITRATE (PF) 100 MCG/2ML IJ SOLN
INTRAMUSCULAR | Status: AC
  Filled 2016-02-05: qty 2

## 2016-02-05 MED ORDER — CYCLOPENTOLATE-PHENYLEPHRINE 0.2-1 % OP SOLN
1.0000 [drp] | OPHTHALMIC | Status: AC
  Administered 2016-02-05 (×3): 1 [drp] via OPHTHALMIC

## 2016-02-05 MED ORDER — LACTATED RINGERS IV SOLN: INTRAVENOUS | Status: DC | PRN

## 2016-02-05 MED ORDER — MIDAZOLAM HCL 2 MG/2ML IJ SOLN
1.0000 mg | INTRAMUSCULAR | Status: DC | PRN
  Administered 2016-02-05: 1 mg via INTRAVENOUS

## 2016-02-05 MED ORDER — FENTANYL CITRATE (PF) 100 MCG/2ML IJ SOLN
25.0000 ug | INTRAMUSCULAR | Status: AC
  Administered 2016-02-05: 25 ug via INTRAVENOUS

## 2016-02-05 MED ORDER — MIDAZOLAM HCL 2 MG/2ML IJ SOLN
INTRAMUSCULAR | Status: AC
  Filled 2016-02-05: qty 2

## 2016-02-05 MED ORDER — TETRACAINE HCL 0.5 % OP SOLN
1.0000 [drp] | OPHTHALMIC | Status: AC
  Administered 2016-02-05 (×3): 1 [drp] via OPHTHALMIC

## 2016-02-05 MED ORDER — POVIDONE-IODINE 5 % OP SOLN
OPHTHALMIC | Status: DC | PRN
  Administered 2016-02-05: 1 via OPHTHALMIC

## 2016-02-05 MED ORDER — NEOMYCIN-POLYMYXIN-DEXAMETH 3.5-10000-0.1 OP SUSP
OPHTHALMIC | Status: DC | PRN
  Administered 2016-02-05: 2 [drp] via OPHTHALMIC

## 2016-02-05 MED ORDER — BSS IO SOLN
INTRAOCULAR | Status: DC | PRN
  Administered 2016-02-05: 500 mL

## 2016-02-05 SURGICAL SUPPLY — 23 items
CAPSULAR TENSION RING-AMO (OPHTHALMIC RELATED) IMPLANT
CLOTH BEACON ORANGE TIMEOUT ST (SAFETY) ×3 IMPLANT
EYE SHIELD UNIVERSAL CLEAR (GAUZE/BANDAGES/DRESSINGS) ×3 IMPLANT
GLOVE BIOGEL PI IND STRL 7.0 (GLOVE) ×1 IMPLANT
GLOVE BIOGEL PI IND STRL 7.5 (GLOVE) IMPLANT
GLOVE BIOGEL PI INDICATOR 7.0 (GLOVE) ×2
GLOVE BIOGEL PI INDICATOR 7.5 (GLOVE)
GLOVE EXAM NITRILE LRG STRL (GLOVE) IMPLANT
GLOVE EXAM NITRILE MD LF STRL (GLOVE) ×3 IMPLANT
KIT VITRECTOMY (OPHTHALMIC RELATED) IMPLANT
PAD ARMBOARD 7.5X6 YLW CONV (MISCELLANEOUS) ×3 IMPLANT
PROC W NO LENS (INTRAOCULAR LENS)
PROC W SPEC LENS (INTRAOCULAR LENS)
PROCESS W NO LENS (INTRAOCULAR LENS) IMPLANT
PROCESS W SPEC LENS (INTRAOCULAR LENS) IMPLANT
RETRACTOR IRIS SIGHTPATH (OPHTHALMIC RELATED) IMPLANT
RING MALYGIN (MISCELLANEOUS) IMPLANT
SIGHTPATH CAT PROC W REG LENS (Ophthalmic Related) ×3 IMPLANT
SYRINGE LUER LOK 1CC (MISCELLANEOUS) ×3 IMPLANT
TAPE SURG TRANSPORE 1 IN (GAUZE/BANDAGES/DRESSINGS) ×1 IMPLANT
TAPE SURGICAL TRANSPORE 1 IN (GAUZE/BANDAGES/DRESSINGS) ×2
VISCOELASTIC ADDITIONAL (OPHTHALMIC RELATED) IMPLANT
WATER STERILE IRR 250ML POUR (IV SOLUTION) ×3 IMPLANT

## 2016-02-05 NOTE — Anesthesia Procedure Notes (Signed)
Procedure Name: MAC Date/Time: 02/05/2016 10:42 AM Performed by: Andree Elk, AMY A Pre-anesthesia Checklist: Patient identified, Timeout performed, Emergency Drugs available, Suction available and Patient being monitored Oxygen Delivery Method: Nasal cannula

## 2016-02-05 NOTE — Anesthesia Preprocedure Evaluation (Signed)
Anesthesia Evaluation  Patient identified by MRN, date of birth, ID band Patient awake    Reviewed: Allergy & Precautions, NPO status , Patient's Chart, lab work & pertinent test results, reviewed documented beta blocker date and time   Airway Mallampati: II  TM Distance: >3 FB     Dental  (+) Edentulous Upper, Partial Lower   Pulmonary former smoker,    breath sounds clear to auscultation       Cardiovascular hypertension, Pt. on home beta blockers and Pt. on medications  Rhythm:Regular Rate:Normal     Neuro/Psych    GI/Hepatic PUD, GERD  ,  Endo/Other  Hypothyroidism   Renal/GU      Musculoskeletal  (+) Arthritis , Rheumatoid disorders,    Abdominal   Peds  Hematology   Anesthesia Other Findings   Reproductive/Obstetrics                             Anesthesia Physical Anesthesia Plan  ASA: II  Anesthesia Plan: MAC   Post-op Pain Management:    Induction: Intravenous  Airway Management Planned: Nasal Cannula  Additional Equipment:   Intra-op Plan:   Post-operative Plan:   Informed Consent: I have reviewed the patients History and Physical, chart, labs and discussed the procedure including the risks, benefits and alternatives for the proposed anesthesia with the patient or authorized representative who has indicated his/her understanding and acceptance.     Plan Discussed with:   Anesthesia Plan Comments:         Anesthesia Quick Evaluation

## 2016-02-05 NOTE — H&P (Signed)
I have reviewed the H&P, the patient was re-examined, and I have identified no interval changes in medical condition and plan of care since the history and physical of record  

## 2016-02-05 NOTE — Discharge Instructions (Signed)
Anesthesia, Adult, Care After °Refer to this sheet in the next few weeks. These instructions provide you with information on caring for yourself after your procedure. Your health care provider may also give you more specific instructions. Your treatment has been planned according to current medical practices, but problems sometimes occur. Call your health care provider if you have any problems or questions after your procedure. °WHAT TO EXPECT AFTER THE PROCEDURE °After the procedure, it is typical to experience: °· Sleepiness. °· Nausea and vomiting. °HOME CARE INSTRUCTIONS °· For the first 24 hours after general anesthesia: °¨ Have a responsible person with you. °¨ Do not drive a car. If you are alone, do not take public transportation. °¨ Do not drink alcohol. °¨ Do not take medicine that has not been prescribed by your health care provider. °¨ Do not sign important papers or make important decisions. °¨ You may resume a normal diet and activities as directed by your health care provider. °· Change bandages (dressings) as directed. °· If you have questions or problems that seem related to general anesthesia, call the hospital and ask for the anesthetist or anesthesiologist on call. °SEEK MEDICAL CARE IF: °· You have nausea and vomiting that continue the day after anesthesia. °· You develop a rash. °SEEK IMMEDIATE MEDICAL CARE IF:  °· You have difficulty breathing. °· You have chest pain. °· You have any allergic problems. °  °This information is not intended to replace advice given to you by your health care provider. Make sure you discuss any questions you have with your health care provider. °  °Document Released: 01/20/2001 Document Revised: 11/04/2014 Document Reviewed: 02/12/2012 °Elsevier Interactive Patient Education ©2016 [employer] ° °

## 2016-02-05 NOTE — Anesthesia Postprocedure Evaluation (Signed)
Anesthesia Post Note  Patient: Loretta Hunt  Procedure(s) Performed: Procedure(s) (LRB): CATARACT EXTRACTION PHACO AND INTRAOCULAR LENS PLACEMENT (IOC) (Right)  Patient location during evaluation: Short Stay Anesthesia Type: MAC Level of consciousness: awake and alert and oriented Pain management: pain level controlled Vital Signs Assessment: post-procedure vital signs reviewed and stable Respiratory status: spontaneous breathing and respiratory function stable Cardiovascular status: stable Postop Assessment: no signs of nausea or vomiting Anesthetic complications: no    Last Vitals:  Filed Vitals:   02/05/16 0951 02/05/16 1000  BP: 132/69 139/78  Pulse: 57   Temp: 36.5 C   Resp: 16 15    Last Pain: There were no vitals filed for this visit.               ADAMS, AMY A

## 2016-02-05 NOTE — Op Note (Signed)
Date of Admission: 02/05/2016  Date of Surgery: 02/05/2016   Pre-Op Dx: Cataract Right Eye  Post-Op Dx: Senile Combined Cataract Right  Eye,  Dx Code [id number]  Surgeon: Tonny Branch, M.D.  Assistants: None  Anesthesia: Topical with MAC  Indications: Painless, progressive loss of vision with compromise of daily activities.  Surgery: Cataract Extraction with Intraocular lens Implant Right Eye  Discription: The patient had dilating drops and viscous lidocaine placed into the Right eye in the pre-op holding area. After transfer to the operating room, a time out was performed. The patient was then prepped and draped. Beginning with a 69 degree blade a paracentesis port was made at the surgeon's 2 o'clock position. The anterior chamber was then filled with 1% non-preserved lidocaine. This was followed by filling the anterior chamber with Provisc.  A 2.58mm keratome blade was used to make a clear corneal incision at the temporal limbus.  A bent cystatome needle was used to create a continuous tear capsulotomy. Hydrodissection was performed with balanced salt solution on a Fine canula. The lens nucleus was then removed using the phacoemulsification handpiece. Residual cortex was removed with the I&A handpiece. The anterior chamber and capsular bag were refilled with Provisc. A posterior chamber intraocular lens was placed into the capsular bag with it's injector. The implant was positioned with the Kuglan hook. The Provisc was then removed from the anterior chamber and capsular bag with the I&A handpiece. Stromal hydration of the main incision and paracentesis port was performed with BSS on a Fine canula. The wounds were tested for leak which was negative. The patient tolerated the procedure well. There were no operative complications. The patient was then transferred to the recovery room in stable condition.  Complications: None  Specimen: None  EBL: None  Prosthetic device: Hoya iSert 250, power 22.5  D, SN [id number].

## 2016-02-05 NOTE — Transfer of Care (Signed)
Immediate Anesthesia Transfer of Care Note  Patient: Loretta Hunt  Procedure(s) Performed: Procedure(s) with comments: CATARACT EXTRACTION PHACO AND INTRAOCULAR LENS PLACEMENT (IOC) (Right) - CDE 12.32  Patient Location: Short Stay  Anesthesia Type:MAC  Level of Consciousness: awake, alert , oriented and patient cooperative  Airway & Oxygen Therapy: Patient Spontanous Breathing  Post-op Assessment: Report given to RN and Post -op Vital signs reviewed and stable  Post vital signs: Reviewed and stable  Last Vitals:  Filed Vitals:   02/05/16 0951 02/05/16 1000  BP: 132/69 139/78  Pulse: 57   Temp: 36.5 C   Resp: 16 15    Complications: No apparent anesthesia complications

## 2016-02-06 ENCOUNTER — Encounter (HOSPITAL_COMMUNITY): Payer: Self-pay | Admitting: Ophthalmology

## 2016-02-12 DIAGNOSIS — H5231 Anisometropia: Secondary | ICD-10-CM | POA: Diagnosis not present

## 2016-02-12 DIAGNOSIS — Z961 Presence of intraocular lens: Secondary | ICD-10-CM | POA: Diagnosis not present

## 2016-02-12 DIAGNOSIS — H25812 Combined forms of age-related cataract, left eye: Secondary | ICD-10-CM | POA: Diagnosis not present

## 2016-02-12 DIAGNOSIS — H5202 Hypermetropia, left eye: Secondary | ICD-10-CM | POA: Diagnosis not present

## 2016-02-19 ENCOUNTER — Encounter (HOSPITAL_COMMUNITY)
Admission: RE | Admit: 2016-02-19 | Discharge: 2016-02-19 | Disposition: A | Discharge status: Home / Self Care | Payer: Medicare Other | Source: Ambulatory Visit | Attending: Ophthalmology | Admitting: Ophthalmology

## 2016-02-19 ENCOUNTER — Encounter (HOSPITAL_COMMUNITY): Payer: Self-pay

## 2016-02-21 ENCOUNTER — Other Ambulatory Visit: Payer: Self-pay | Admitting: Internal Medicine

## 2016-02-22 ENCOUNTER — Ambulatory Visit (HOSPITAL_COMMUNITY): Payer: Medicare Other | Admitting: Anesthesiology

## 2016-02-22 ENCOUNTER — Ambulatory Visit (HOSPITAL_COMMUNITY)
Admission: RE | Admit: 2016-02-22 | Discharge: 2016-02-22 | Disposition: A | Discharge status: Home / Self Care | Payer: Medicare Other | Source: Ambulatory Visit | Attending: Ophthalmology | Admitting: Ophthalmology

## 2016-02-22 ENCOUNTER — Encounter (HOSPITAL_COMMUNITY): Payer: Self-pay | Admitting: *Deleted

## 2016-02-22 ENCOUNTER — Encounter (HOSPITAL_COMMUNITY)
Admission: RE | Disposition: A | Discharge status: Home / Self Care | Payer: Self-pay | Source: Ambulatory Visit | Attending: Ophthalmology

## 2016-02-22 DIAGNOSIS — I10 Essential (primary) hypertension: Secondary | ICD-10-CM | POA: Insufficient documentation

## 2016-02-22 DIAGNOSIS — Z87891 Personal history of nicotine dependence: Secondary | ICD-10-CM | POA: Diagnosis not present

## 2016-02-22 DIAGNOSIS — H25812 Combined forms of age-related cataract, left eye: Secondary | ICD-10-CM | POA: Insufficient documentation

## 2016-02-22 DIAGNOSIS — Z79899 Other long term (current) drug therapy: Secondary | ICD-10-CM | POA: Diagnosis not present

## 2016-02-22 DIAGNOSIS — Z7982 Long term (current) use of aspirin: Secondary | ICD-10-CM | POA: Diagnosis not present

## 2016-02-22 DIAGNOSIS — M1991 Primary osteoarthritis, unspecified site: Secondary | ICD-10-CM | POA: Insufficient documentation

## 2016-02-22 DIAGNOSIS — E039 Hypothyroidism, unspecified: Secondary | ICD-10-CM | POA: Insufficient documentation

## 2016-02-22 DIAGNOSIS — H269 Unspecified cataract: Secondary | ICD-10-CM | POA: Diagnosis not present

## 2016-02-22 DIAGNOSIS — K219 Gastro-esophageal reflux disease without esophagitis: Secondary | ICD-10-CM | POA: Diagnosis not present

## 2016-02-22 HISTORY — PX: CATARACT EXTRACTION W/PHACO: SHX586

## 2016-02-22 SURGERY — PHACOEMULSIFICATION, CATARACT, WITH IOL INSERTION
Anesthesia: Monitor Anesthesia Care | Site: Eye | Laterality: Left

## 2016-02-22 MED ORDER — CYCLOPENTOLATE-PHENYLEPHRINE 0.2-1 % OP SOLN
1.0000 [drp] | OPHTHALMIC | Status: AC
  Administered 2016-02-22 (×3): 1 [drp] via OPHTHALMIC

## 2016-02-22 MED ORDER — LIDOCAINE HCL 3.5 % OP GEL
1.0000 "application " | Freq: Once | OPHTHALMIC | Status: AC
  Administered 2016-02-22: 1 via OPHTHALMIC

## 2016-02-22 MED ORDER — FENTANYL CITRATE (PF) 100 MCG/2ML IJ SOLN
25.0000 ug | INTRAMUSCULAR | Status: AC
  Administered 2016-02-22: 25 ug via INTRAVENOUS

## 2016-02-22 MED ORDER — LIDOCAINE 3.5 % OP GEL OPTIME - NO CHARGE
OPHTHALMIC | Status: DC | PRN
  Administered 2016-02-22: 1 [drp] via OPHTHALMIC

## 2016-02-22 MED ORDER — PROVISC 10 MG/ML IO SOLN
INTRAOCULAR | Status: DC | PRN
  Administered 2016-02-22: 0.85 mL via INTRAOCULAR

## 2016-02-22 MED ORDER — FENTANYL CITRATE (PF) 100 MCG/2ML IJ SOLN
INTRAMUSCULAR | Status: AC
  Filled 2016-02-22: qty 2

## 2016-02-22 MED ORDER — MIDAZOLAM HCL 2 MG/2ML IJ SOLN
1.0000 mg | INTRAMUSCULAR | Status: DC | PRN
  Administered 2016-02-22: 2 mg via INTRAVENOUS

## 2016-02-22 MED ORDER — POVIDONE-IODINE 5 % OP SOLN
OPHTHALMIC | Status: DC | PRN
  Administered 2016-02-22: 1 via OPHTHALMIC

## 2016-02-22 MED ORDER — EPINEPHRINE HCL 1 MG/ML IJ SOLN
INTRAMUSCULAR | Status: AC
  Filled 2016-02-22: qty 1

## 2016-02-22 MED ORDER — LIDOCAINE HCL (PF) 1 % IJ SOLN
INTRAMUSCULAR | Status: DC | PRN
  Administered 2016-02-22: .6 mL

## 2016-02-22 MED ORDER — TETRACAINE HCL 0.5 % OP SOLN
1.0000 [drp] | OPHTHALMIC | Status: AC
  Administered 2016-02-22 (×3): 1 [drp] via OPHTHALMIC

## 2016-02-22 MED ORDER — PHENYLEPHRINE HCL 2.5 % OP SOLN
1.0000 [drp] | OPHTHALMIC | Status: AC
  Administered 2016-02-22 (×3): 1 [drp] via OPHTHALMIC

## 2016-02-22 MED ORDER — LACTATED RINGERS IV SOLN
INTRAVENOUS | Status: DC
  Administered 2016-02-22: 10:00:00 via INTRAVENOUS

## 2016-02-22 MED ORDER — MIDAZOLAM HCL 2 MG/2ML IJ SOLN
INTRAMUSCULAR | Status: AC
  Filled 2016-02-22: qty 2

## 2016-02-22 MED ORDER — EPINEPHRINE HCL 1 MG/ML IJ SOLN
INTRAOCULAR | Status: DC | PRN
  Administered 2016-02-22: 500 mL

## 2016-02-22 MED ORDER — BSS IO SOLN
INTRAOCULAR | Status: DC | PRN
  Administered 2016-02-22: 15 mL via INTRAOCULAR

## 2016-02-22 MED ORDER — NEOMYCIN-POLYMYXIN-DEXAMETH 3.5-10000-0.1 OP SUSP
OPHTHALMIC | Status: DC | PRN
  Administered 2016-02-22: 1 [drp] via OPHTHALMIC

## 2016-02-22 SURGICAL SUPPLY — 23 items
CAPSULAR TENSION RING-AMO (OPHTHALMIC RELATED) IMPLANT
CLOTH BEACON ORANGE TIMEOUT ST (SAFETY) ×3 IMPLANT
EYE SHIELD UNIVERSAL CLEAR (GAUZE/BANDAGES/DRESSINGS) ×3 IMPLANT
GLOVE BIOGEL PI IND STRL 7.0 (GLOVE) ×1 IMPLANT
GLOVE BIOGEL PI IND STRL 7.5 (GLOVE) IMPLANT
GLOVE BIOGEL PI INDICATOR 7.0 (GLOVE) ×2
GLOVE BIOGEL PI INDICATOR 7.5 (GLOVE)
GLOVE EXAM NITRILE LRG STRL (GLOVE) IMPLANT
GLOVE EXAM NITRILE MD LF STRL (GLOVE) ×3 IMPLANT
KIT VITRECTOMY (OPHTHALMIC RELATED) IMPLANT
PAD ARMBOARD 7.5X6 YLW CONV (MISCELLANEOUS) ×3 IMPLANT
PROC W NO LENS (INTRAOCULAR LENS)
PROC W SPEC LENS (INTRAOCULAR LENS)
PROCESS W NO LENS (INTRAOCULAR LENS) IMPLANT
PROCESS W SPEC LENS (INTRAOCULAR LENS) IMPLANT
RETRACTOR IRIS SIGHTPATH (OPHTHALMIC RELATED) IMPLANT
RING MALYGIN (MISCELLANEOUS) IMPLANT
SIGHTPATH CAT PROC W REG LENS (Ophthalmic Related) ×3 IMPLANT
SYRINGE LUER LOK 1CC (MISCELLANEOUS) ×3 IMPLANT
TAPE SURG TRANSPORE 1 IN (GAUZE/BANDAGES/DRESSINGS) ×1 IMPLANT
TAPE SURGICAL TRANSPORE 1 IN (GAUZE/BANDAGES/DRESSINGS) ×2
VISCOELASTIC ADDITIONAL (OPHTHALMIC RELATED) IMPLANT
WATER STERILE IRR 250ML POUR (IV SOLUTION) ×3 IMPLANT

## 2016-02-22 NOTE — Anesthesia Postprocedure Evaluation (Signed)
Anesthesia Post Note  Patient: Loretta Hunt  Procedure(s) Performed: Procedure(s) (LRB): CATARACT EXTRACTION PHACO AND INTRAOCULAR LENS PLACEMENT (IOC) (Left)  Patient location during evaluation: Short Stay Anesthesia Type: MAC Level of consciousness: awake and alert, oriented and patient cooperative Pain management: pain level controlled Vital Signs Assessment: post-procedure vital signs reviewed and stable Respiratory status: spontaneous breathing, nonlabored ventilation and respiratory function stable Cardiovascular status: blood pressure returned to baseline Postop Assessment: no signs of nausea or vomiting Anesthetic complications: no    Last Vitals:  Filed Vitals:   02/22/16 1045 02/22/16 1112  BP:  149/59  Pulse:  57  Temp:  36.3 C  Resp: 15 16    Last Pain: There were no vitals filed for this visit.               Kyandra Mcclaine J

## 2016-02-22 NOTE — H&P (Signed)
I have reviewed the H&P, the patient was re-examined, and I have identified no interval changes in medical condition and plan of care since the history and physical of record  

## 2016-02-22 NOTE — Anesthesia Preprocedure Evaluation (Signed)
Anesthesia Evaluation  Patient identified by MRN, date of birth, ID band Patient awake    Reviewed: Allergy & Precautions, NPO status , Patient's Chart, lab work & pertinent test results, reviewed documented beta blocker date and time   Airway Mallampati: II  TM Distance: >3 FB     Dental  (+) Edentulous Upper, Partial Lower   Pulmonary former smoker,    breath sounds clear to auscultation       Cardiovascular hypertension, Pt. on home beta blockers and Pt. on medications  Rhythm:Regular Rate:Normal     Neuro/Psych    GI/Hepatic PUD, GERD  ,  Endo/Other  Hypothyroidism   Renal/GU      Musculoskeletal  (+) Arthritis , Rheumatoid disorders,    Abdominal   Peds  Hematology   Anesthesia Other Findings   Reproductive/Obstetrics                             Anesthesia Physical Anesthesia Plan  ASA: II  Anesthesia Plan: MAC   Post-op Pain Management:    Induction: Intravenous  Airway Management Planned: Nasal Cannula  Additional Equipment:   Intra-op Plan:   Post-operative Plan:   Informed Consent: I have reviewed the patients History and Physical, chart, labs and discussed the procedure including the risks, benefits and alternatives for the proposed anesthesia with the patient or authorized representative who has indicated his/her understanding and acceptance.     Plan Discussed with:   Anesthesia Plan Comments:         Anesthesia Quick Evaluation

## 2016-02-22 NOTE — Op Note (Signed)
Date of Admission: 02/22/2016  Date of Surgery: 02/22/2016   Pre-Op Dx: Cataract Left Eye  Post-Op Dx: Senile Combined Cataract Left  Eye,  Dx Code [id number]  Surgeon: Tonny Branch, M.D.  Assistants: None  Anesthesia: Topical with MAC  Indications: Painless, progressive loss of vision with compromise of daily activities.  Surgery: Cataract Extraction with Intraocular lens Implant Left Eye  Discription: The patient had dilating drops and viscous lidocaine placed into the Left eye in the pre-op holding area. After transfer to the operating room, a time out was performed. The patient was then prepped and draped. Beginning with a 12 degree blade a paracentesis port was made at the surgeon's 2 o'clock position. The anterior chamber was then filled with 1% non-preserved lidocaine. This was followed by filling the anterior chamber with Provisc.  A 2.35mm keratome blade was used to make a clear corneal incision at the temporal limbus.  A bent cystatome needle was used to create a continuous tear capsulotomy. Hydrodissection was performed with balanced salt solution on a Fine canula. The lens nucleus was then removed using the phacoemulsification handpiece. Residual cortex was removed with the I&A handpiece. The anterior chamber and capsular bag were refilled with Provisc. A posterior chamber intraocular lens was placed into the capsular bag with it's injector. The implant was positioned with the Kuglan hook. The Provisc was then removed from the anterior chamber and capsular bag with the I&A handpiece. Stromal hydration of the main incision and paracentesis port was performed with BSS on a Fine canula. The wounds were tested for leak which was negative. The patient tolerated the procedure well. There were no operative complications. The patient was then transferred to the recovery room in stable condition.  Complications: None  Specimen: None  EBL: None  Prosthetic device: Hoya iSert 250, power 23.5 D, SN  [id number].

## 2016-02-22 NOTE — Transfer of Care (Signed)
Immediate Anesthesia Transfer of Care Note  Patient: Loretta Hunt  Procedure(s) Performed: Procedure(s) with comments: CATARACT EXTRACTION PHACO AND INTRAOCULAR LENS PLACEMENT (IOC) (Left) - CDE: 16.94   Patient Location: Short Stay  Anesthesia Type:MAC  Level of Consciousness: awake, alert , oriented and patient cooperative  Airway & Oxygen Therapy: Patient Spontanous Breathing  Post-op Assessment: Report given to RN, Post -op Vital signs reviewed and stable and Patient moving all extremities  Post vital signs: Reviewed and stable  Last Vitals:  Filed Vitals:   02/22/16 1040 02/22/16 1045  BP: 126/67   Pulse:    Temp:    Resp: 12 15    Last Pain: There were no vitals filed for this visit.    Patients Stated Pain Goal: 6 ([id number])  Complications: No apparent anesthesia complications

## 2016-02-22 NOTE — Discharge Instructions (Signed)

## 2016-02-23 ENCOUNTER — Encounter (HOSPITAL_COMMUNITY): Payer: Self-pay | Admitting: Ophthalmology

## 2016-06-10 DIAGNOSIS — E782 Mixed hyperlipidemia: Secondary | ICD-10-CM | POA: Diagnosis not present

## 2016-06-10 DIAGNOSIS — I10 Essential (primary) hypertension: Secondary | ICD-10-CM | POA: Diagnosis not present

## 2016-06-10 DIAGNOSIS — Z6830 Body mass index (BMI) 30.0-30.9, adult: Secondary | ICD-10-CM | POA: Diagnosis not present

## 2016-06-24 DIAGNOSIS — Z1389 Encounter for screening for other disorder: Secondary | ICD-10-CM | POA: Diagnosis not present

## 2016-06-24 DIAGNOSIS — Z6830 Body mass index (BMI) 30.0-30.9, adult: Secondary | ICD-10-CM | POA: Diagnosis not present

## 2016-06-24 DIAGNOSIS — E6609 Other obesity due to excess calories: Secondary | ICD-10-CM | POA: Diagnosis not present

## 2016-06-24 DIAGNOSIS — E039 Hypothyroidism, unspecified: Secondary | ICD-10-CM | POA: Diagnosis not present

## 2016-07-26 DIAGNOSIS — Z23 Encounter for immunization: Secondary | ICD-10-CM | POA: Diagnosis not present

## 2016-08-12 DIAGNOSIS — Z6830 Body mass index (BMI) 30.0-30.9, adult: Secondary | ICD-10-CM | POA: Diagnosis not present

## 2016-08-12 DIAGNOSIS — E782 Mixed hyperlipidemia: Secondary | ICD-10-CM | POA: Diagnosis not present

## 2016-08-12 DIAGNOSIS — R070 Pain in throat: Secondary | ICD-10-CM | POA: Diagnosis not present

## 2016-08-12 DIAGNOSIS — J040 Acute laryngitis: Secondary | ICD-10-CM | POA: Diagnosis not present

## 2016-08-12 DIAGNOSIS — I10 Essential (primary) hypertension: Secondary | ICD-10-CM | POA: Diagnosis not present

## 2016-08-12 DIAGNOSIS — J343 Hypertrophy of nasal turbinates: Secondary | ICD-10-CM | POA: Diagnosis not present

## 2016-08-12 DIAGNOSIS — Z1389 Encounter for screening for other disorder: Secondary | ICD-10-CM | POA: Diagnosis not present

## 2016-09-03 ENCOUNTER — Other Ambulatory Visit: Payer: Self-pay | Admitting: Internal Medicine

## 2016-09-11 DIAGNOSIS — E782 Mixed hyperlipidemia: Secondary | ICD-10-CM | POA: Diagnosis not present

## 2016-09-11 DIAGNOSIS — Z6830 Body mass index (BMI) 30.0-30.9, adult: Secondary | ICD-10-CM | POA: Diagnosis not present

## 2016-09-11 DIAGNOSIS — I10 Essential (primary) hypertension: Secondary | ICD-10-CM | POA: Diagnosis not present

## 2017-02-05 DIAGNOSIS — B351 Tinea unguium: Secondary | ICD-10-CM | POA: Diagnosis not present

## 2017-02-05 DIAGNOSIS — L110 Acquired keratosis follicularis: Secondary | ICD-10-CM | POA: Diagnosis not present

## 2017-02-05 DIAGNOSIS — I739 Peripheral vascular disease, unspecified: Secondary | ICD-10-CM | POA: Diagnosis not present

## 2017-02-10 DIAGNOSIS — M19072 Primary osteoarthritis, left ankle and foot: Secondary | ICD-10-CM | POA: Diagnosis not present

## 2017-02-10 DIAGNOSIS — M2142 Flat foot [pes planus] (acquired), left foot: Secondary | ICD-10-CM | POA: Diagnosis not present

## 2017-02-24 ENCOUNTER — Encounter: Payer: Self-pay | Admitting: Sports Medicine

## 2017-02-24 ENCOUNTER — Ambulatory Visit (INDEPENDENT_AMBULATORY_CARE_PROVIDER_SITE_OTHER): Payer: Medicare Other | Admitting: Sports Medicine

## 2017-02-24 VITALS — BP 131/66 | Ht 71.0 in | Wt 212.0 lb

## 2017-02-24 DIAGNOSIS — M19072 Primary osteoarthritis, left ankle and foot: Secondary | ICD-10-CM

## 2017-02-24 NOTE — Progress Notes (Signed)
   Subjective:    Patient ID: Loretta Hunt, female    DOB: Nov 19, 1940, 76 y.o.   MRN: [id number]  HPI chief complaint: Left ankle pain  Very pleasant 76 year old female comes over today at the request of Dr. Noemi Chapel for custom orthotics. She has a history of a talonavicular dislocation 15 years ago with ORIF for this. She has chronic foot and ankle pain that she localizes to the subtalar joint. No numbness or tingling. X-rays from Dr. Archie Endo office are available for review. She gets some pain in the right ankle as well but not as severe.  Past medical history reviewed Medications reviewed Allergies reviewed     Review of Systems  As above    Objective:   Physical Exam  Well-developed, well-nourished. No acute distress. Awake alert and oriented 3. Vital signs reviewed  Left ankle: Mild pes planus with standing. No swelling. No effusion. Decreased subtalar range of motion. Fairly well-preserved tibiotalar motion. Good pulses. Neurovascularly intact distally.  X-rays from Dr. Archie Endo office dated 02/10/2017 are reviewed. There is evidence of her previous ORIF of her talus. 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 Tibiotalar joint appears to be fairly well-preserved.      Assessment & Plan:  Status post remote talus ORIF, left foot Bilateral foot pain  Patient was fitted with custom orthotics. She found them to be comfortable prior to leaving the office. A total of 30 minutes was spent with the patient with greater than 50% of the time spent in face-to-face consultation discussing orthotic construction, instruction, and fitting. Neutral gait was observed after fitting. Follow-up with me as needed.  Patient was fitted for a : standard, cushioned, semi-rigid orthotic. The orthotic was heated and afterward the patient stood on the orthotic blank positioned on the orthotic stand. The patient was positioned in subtalar neutral position and 10 degrees of  ankle dorsiflexion in a weight bearing stance. After completion of molding, a stable base was applied to the orthotic blank. The blank was ground to a stable position for weight bearing. Size: 11 Base: Blue EVA Posting: none Additional orthotic padding: none

## 2017-02-25 ENCOUNTER — Encounter: Payer: Self-pay | Admitting: Sports Medicine

## 2017-02-27 DIAGNOSIS — M19072 Primary osteoarthritis, left ankle and foot: Secondary | ICD-10-CM | POA: Diagnosis not present

## 2017-04-07 DIAGNOSIS — I10 Essential (primary) hypertension: Secondary | ICD-10-CM | POA: Diagnosis not present

## 2017-04-07 DIAGNOSIS — Z6830 Body mass index (BMI) 30.0-30.9, adult: Secondary | ICD-10-CM | POA: Diagnosis not present

## 2017-04-07 DIAGNOSIS — E6609 Other obesity due to excess calories: Secondary | ICD-10-CM | POA: Diagnosis not present

## 2017-05-14 DIAGNOSIS — I739 Peripheral vascular disease, unspecified: Secondary | ICD-10-CM | POA: Diagnosis not present

## 2017-06-25 DIAGNOSIS — Z23 Encounter for immunization: Secondary | ICD-10-CM | POA: Diagnosis not present

## 2017-10-22 DIAGNOSIS — Z0001 Encounter for general adult medical examination with abnormal findings: Secondary | ICD-10-CM | POA: Diagnosis not present

## 2017-10-22 DIAGNOSIS — E6609 Other obesity due to excess calories: Secondary | ICD-10-CM | POA: Diagnosis not present

## 2017-10-22 DIAGNOSIS — E039 Hypothyroidism, unspecified: Secondary | ICD-10-CM | POA: Diagnosis not present

## 2017-10-22 DIAGNOSIS — Z6830 Body mass index (BMI) 30.0-30.9, adult: Secondary | ICD-10-CM | POA: Diagnosis not present

## 2017-10-22 DIAGNOSIS — Z0000 Encounter for general adult medical examination without abnormal findings: Secondary | ICD-10-CM | POA: Diagnosis not present

## 2017-10-22 DIAGNOSIS — Z1389 Encounter for screening for other disorder: Secondary | ICD-10-CM | POA: Diagnosis not present

## 2018-03-04 DIAGNOSIS — M25552 Pain in left hip: Secondary | ICD-10-CM | POA: Diagnosis not present

## 2018-03-04 DIAGNOSIS — Z6830 Body mass index (BMI) 30.0-30.9, adult: Secondary | ICD-10-CM | POA: Diagnosis not present

## 2018-03-04 DIAGNOSIS — Z1389 Encounter for screening for other disorder: Secondary | ICD-10-CM | POA: Diagnosis not present

## 2018-03-04 DIAGNOSIS — E6609 Other obesity due to excess calories: Secondary | ICD-10-CM | POA: Diagnosis not present

## 2018-03-05 ENCOUNTER — Other Ambulatory Visit (HOSPITAL_COMMUNITY): Payer: Self-pay | Admitting: Physician Assistant

## 2018-03-05 ENCOUNTER — Ambulatory Visit (HOSPITAL_COMMUNITY)
Admission: RE | Admit: 2018-03-05 | Discharge: 2018-03-05 | Disposition: A | Discharge status: Home / Self Care | Payer: Medicare Other | Source: Ambulatory Visit | Attending: Physician Assistant | Admitting: Physician Assistant

## 2018-03-05 DIAGNOSIS — E6609 Other obesity due to excess calories: Secondary | ICD-10-CM | POA: Diagnosis not present

## 2018-03-05 DIAGNOSIS — M25552 Pain in left hip: Secondary | ICD-10-CM | POA: Diagnosis not present

## 2018-03-05 DIAGNOSIS — Z6830 Body mass index (BMI) 30.0-30.9, adult: Secondary | ICD-10-CM | POA: Diagnosis not present

## 2018-03-05 DIAGNOSIS — M8588 Other specified disorders of bone density and structure, other site: Secondary | ICD-10-CM | POA: Insufficient documentation

## 2018-03-05 DIAGNOSIS — Z1389 Encounter for screening for other disorder: Secondary | ICD-10-CM | POA: Diagnosis not present

## 2018-03-05 DIAGNOSIS — M1612 Unilateral primary osteoarthritis, left hip: Secondary | ICD-10-CM | POA: Diagnosis not present

## 2018-07-23 DIAGNOSIS — Z23 Encounter for immunization: Secondary | ICD-10-CM | POA: Diagnosis not present

## 2018-11-02 DIAGNOSIS — I10 Essential (primary) hypertension: Secondary | ICD-10-CM | POA: Diagnosis not present

## 2018-11-02 DIAGNOSIS — Z1389 Encounter for screening for other disorder: Secondary | ICD-10-CM | POA: Diagnosis not present

## 2018-11-02 DIAGNOSIS — Z0000 Encounter for general adult medical examination without abnormal findings: Secondary | ICD-10-CM | POA: Diagnosis not present

## 2018-11-02 DIAGNOSIS — E039 Hypothyroidism, unspecified: Secondary | ICD-10-CM | POA: Diagnosis not present

## 2018-11-02 DIAGNOSIS — Z6830 Body mass index (BMI) 30.0-30.9, adult: Secondary | ICD-10-CM | POA: Diagnosis not present

## 2018-11-02 DIAGNOSIS — E782 Mixed hyperlipidemia: Secondary | ICD-10-CM | POA: Diagnosis not present

## 2018-11-18 DIAGNOSIS — Z0000 Encounter for general adult medical examination without abnormal findings: Secondary | ICD-10-CM | POA: Diagnosis not present

## 2018-11-18 DIAGNOSIS — E039 Hypothyroidism, unspecified: Secondary | ICD-10-CM | POA: Diagnosis not present

## 2019-05-04 DIAGNOSIS — E6609 Other obesity due to excess calories: Secondary | ICD-10-CM | POA: Diagnosis not present

## 2019-05-04 DIAGNOSIS — M25572 Pain in left ankle and joints of left foot: Secondary | ICD-10-CM | POA: Diagnosis not present

## 2019-05-04 DIAGNOSIS — M19072 Primary osteoarthritis, left ankle and foot: Secondary | ICD-10-CM | POA: Diagnosis not present

## 2019-05-04 DIAGNOSIS — Z1389 Encounter for screening for other disorder: Secondary | ICD-10-CM | POA: Diagnosis not present

## 2019-05-04 DIAGNOSIS — Z6831 Body mass index (BMI) 31.0-31.9, adult: Secondary | ICD-10-CM | POA: Diagnosis not present

## 2019-05-17 DIAGNOSIS — H3540 Unspecified peripheral retinal degeneration: Secondary | ICD-10-CM | POA: Diagnosis not present

## 2019-07-07 DIAGNOSIS — Z23 Encounter for immunization: Secondary | ICD-10-CM | POA: Diagnosis not present

## 2019-09-27 DIAGNOSIS — M19072 Primary osteoarthritis, left ankle and foot: Secondary | ICD-10-CM | POA: Diagnosis not present

## 2019-09-27 DIAGNOSIS — E7849 Other hyperlipidemia: Secondary | ICD-10-CM | POA: Diagnosis not present

## 2019-09-27 DIAGNOSIS — I10 Essential (primary) hypertension: Secondary | ICD-10-CM | POA: Diagnosis not present

## 2019-10-28 DIAGNOSIS — I10 Essential (primary) hypertension: Secondary | ICD-10-CM | POA: Diagnosis not present

## 2019-10-28 DIAGNOSIS — E039 Hypothyroidism, unspecified: Secondary | ICD-10-CM | POA: Diagnosis not present

## 2019-11-17 DIAGNOSIS — Z6832 Body mass index (BMI) 32.0-32.9, adult: Secondary | ICD-10-CM | POA: Diagnosis not present

## 2019-11-17 DIAGNOSIS — I10 Essential (primary) hypertension: Secondary | ICD-10-CM | POA: Diagnosis not present

## 2019-11-17 DIAGNOSIS — Z0001 Encounter for general adult medical examination with abnormal findings: Secondary | ICD-10-CM | POA: Diagnosis not present

## 2019-11-17 DIAGNOSIS — E6609 Other obesity due to excess calories: Secondary | ICD-10-CM | POA: Diagnosis not present

## 2019-11-17 DIAGNOSIS — Z1389 Encounter for screening for other disorder: Secondary | ICD-10-CM | POA: Diagnosis not present

## 2019-11-17 DIAGNOSIS — R008 Other abnormalities of heart beat: Secondary | ICD-10-CM | POA: Diagnosis not present

## 2019-11-17 DIAGNOSIS — M1991 Primary osteoarthritis, unspecified site: Secondary | ICD-10-CM | POA: Diagnosis not present

## 2019-11-28 DIAGNOSIS — I10 Essential (primary) hypertension: Secondary | ICD-10-CM | POA: Diagnosis not present

## 2019-11-28 DIAGNOSIS — E039 Hypothyroidism, unspecified: Secondary | ICD-10-CM | POA: Diagnosis not present

## 2019-12-04 ENCOUNTER — Ambulatory Visit: Payer: Medicare Other | Attending: Internal Medicine

## 2019-12-04 ENCOUNTER — Other Ambulatory Visit: Payer: Self-pay

## 2019-12-04 DIAGNOSIS — Z23 Encounter for immunization: Secondary | ICD-10-CM

## 2019-12-04 NOTE — Progress Notes (Signed)
   Covid-19 Vaccination Clinic  Name:  Loretta Hunt    MRN: [id number] DOB: 05-Nov-1940  12/04/2019  [name] was observed post Covid-19 immunization for 15 minutes without incidence. She was provided with Vaccine Information Sheet and instruction to access the V-Safe system.   [name] was instructed to call 911 with any severe reactions post vaccine: Marland Kitchen Difficulty breathing  . Swelling of your face and throat  . A fast heartbeat  . A bad rash all over your body  . Dizziness and weakness    Immunizations Administered    Name Date Dose VIS Date Route   Moderna COVID-19 Vaccine 12/04/2019 10:45 AM 0.5 mL 09/28/2019 Intramuscular   Manufacturer: Moderna   Lot: [id number]   [id number]

## 2019-12-26 DIAGNOSIS — E039 Hypothyroidism, unspecified: Secondary | ICD-10-CM | POA: Diagnosis not present

## 2019-12-26 DIAGNOSIS — I10 Essential (primary) hypertension: Secondary | ICD-10-CM | POA: Diagnosis not present

## 2020-01-04 ENCOUNTER — Ambulatory Visit: Payer: Medicare Other | Attending: Internal Medicine

## 2020-01-04 DIAGNOSIS — Z23 Encounter for immunization: Secondary | ICD-10-CM | POA: Insufficient documentation

## 2020-01-04 NOTE — Progress Notes (Signed)
   Covid-19 Vaccination Clinic  Name:  Loretta Hunt    MRN: [id number] DOB: Apr 11, 1941  01/04/2020  [name] was observed post Covid-19 immunization for 15 minutes without incident. She was provided with Vaccine Information Sheet and instruction to access the V-Safe system.   [name] was instructed to call 911 with any severe reactions post vaccine: Marland Kitchen Difficulty breathing  . Swelling of face and throat  . A fast heartbeat  . A bad rash all over body  . Dizziness and weakness   Immunizations Administered    Name Date Dose VIS Date Route   Moderna COVID-19 Vaccine 01/04/2020  9:56 AM 0.5 mL 09/28/2019 Intramuscular   Manufacturer: Moderna   Lot: [id number]   [id number]

## 2020-01-26 DIAGNOSIS — I10 Essential (primary) hypertension: Secondary | ICD-10-CM | POA: Diagnosis not present

## 2020-01-26 DIAGNOSIS — E039 Hypothyroidism, unspecified: Secondary | ICD-10-CM | POA: Diagnosis not present

## 2020-02-25 DIAGNOSIS — E039 Hypothyroidism, unspecified: Secondary | ICD-10-CM | POA: Diagnosis not present

## 2020-02-25 DIAGNOSIS — M19072 Primary osteoarthritis, left ankle and foot: Secondary | ICD-10-CM | POA: Diagnosis not present

## 2020-02-25 DIAGNOSIS — E7849 Other hyperlipidemia: Secondary | ICD-10-CM | POA: Diagnosis not present

## 2020-02-25 DIAGNOSIS — I10 Essential (primary) hypertension: Secondary | ICD-10-CM | POA: Diagnosis not present

## 2020-03-27 DIAGNOSIS — M19072 Primary osteoarthritis, left ankle and foot: Secondary | ICD-10-CM | POA: Diagnosis not present

## 2020-03-27 DIAGNOSIS — I10 Essential (primary) hypertension: Secondary | ICD-10-CM | POA: Diagnosis not present

## 2020-03-27 DIAGNOSIS — E7849 Other hyperlipidemia: Secondary | ICD-10-CM | POA: Diagnosis not present

## 2020-03-27 DIAGNOSIS — E039 Hypothyroidism, unspecified: Secondary | ICD-10-CM | POA: Diagnosis not present

## 2020-03-28 DIAGNOSIS — R220 Localized swelling, mass and lump, head: Secondary | ICD-10-CM | POA: Diagnosis not present

## 2020-03-28 DIAGNOSIS — Z6832 Body mass index (BMI) 32.0-32.9, adult: Secondary | ICD-10-CM | POA: Diagnosis not present

## 2020-03-28 DIAGNOSIS — W19XXXA Unspecified fall, initial encounter: Secondary | ICD-10-CM | POA: Diagnosis not present

## 2020-03-28 DIAGNOSIS — S8001XA Contusion of right knee, initial encounter: Secondary | ICD-10-CM | POA: Diagnosis not present

## 2020-03-29 ENCOUNTER — Encounter (HOSPITAL_COMMUNITY): Payer: Self-pay

## 2020-03-29 ENCOUNTER — Emergency Department (HOSPITAL_COMMUNITY): Payer: Medicare Other

## 2020-03-29 ENCOUNTER — Other Ambulatory Visit: Payer: Self-pay

## 2020-03-29 ENCOUNTER — Observation Stay (HOSPITAL_COMMUNITY)
Admission: EM | Admit: 2020-03-29 | Discharge: 2020-03-30 | Disposition: A | Discharge status: Home / Self Care | Payer: Medicare Other | Attending: Family Medicine | Admitting: Family Medicine

## 2020-03-29 DIAGNOSIS — I10 Essential (primary) hypertension: Secondary | ICD-10-CM | POA: Insufficient documentation

## 2020-03-29 DIAGNOSIS — R110 Nausea: Secondary | ICD-10-CM | POA: Diagnosis not present

## 2020-03-29 DIAGNOSIS — M79672 Pain in left foot: Secondary | ICD-10-CM | POA: Insufficient documentation

## 2020-03-29 DIAGNOSIS — J9811 Atelectasis: Secondary | ICD-10-CM | POA: Diagnosis not present

## 2020-03-29 DIAGNOSIS — R1110 Vomiting, unspecified: Secondary | ICD-10-CM | POA: Diagnosis not present

## 2020-03-29 DIAGNOSIS — R55 Syncope and collapse: Secondary | ICD-10-CM | POA: Diagnosis not present

## 2020-03-29 DIAGNOSIS — Z96651 Presence of right artificial knee joint: Secondary | ICD-10-CM | POA: Diagnosis not present

## 2020-03-29 DIAGNOSIS — S199XXA Unspecified injury of neck, initial encounter: Secondary | ICD-10-CM | POA: Diagnosis not present

## 2020-03-29 DIAGNOSIS — I4891 Unspecified atrial fibrillation: Secondary | ICD-10-CM | POA: Diagnosis present

## 2020-03-29 DIAGNOSIS — K219 Gastro-esophageal reflux disease without esophagitis: Secondary | ICD-10-CM | POA: Diagnosis not present

## 2020-03-29 DIAGNOSIS — R410 Disorientation, unspecified: Secondary | ICD-10-CM | POA: Insufficient documentation

## 2020-03-29 DIAGNOSIS — Z96653 Presence of artificial knee joint, bilateral: Secondary | ICD-10-CM | POA: Diagnosis not present

## 2020-03-29 DIAGNOSIS — Z7982 Long term (current) use of aspirin: Secondary | ICD-10-CM | POA: Diagnosis not present

## 2020-03-29 DIAGNOSIS — R0689 Other abnormalities of breathing: Secondary | ICD-10-CM | POA: Diagnosis not present

## 2020-03-29 DIAGNOSIS — Z8711 Personal history of peptic ulcer disease: Secondary | ICD-10-CM | POA: Diagnosis not present

## 2020-03-29 DIAGNOSIS — Z87891 Personal history of nicotine dependence: Secondary | ICD-10-CM | POA: Insufficient documentation

## 2020-03-29 DIAGNOSIS — Z79890 Hormone replacement therapy (postmenopausal): Secondary | ICD-10-CM | POA: Diagnosis not present

## 2020-03-29 DIAGNOSIS — M25561 Pain in right knee: Secondary | ICD-10-CM | POA: Insufficient documentation

## 2020-03-29 DIAGNOSIS — E039 Hypothyroidism, unspecified: Secondary | ICD-10-CM | POA: Diagnosis not present

## 2020-03-29 DIAGNOSIS — Z20822 Contact with and (suspected) exposure to covid-19: Secondary | ICD-10-CM | POA: Diagnosis not present

## 2020-03-29 DIAGNOSIS — S8991XA Unspecified injury of right lower leg, initial encounter: Secondary | ICD-10-CM | POA: Diagnosis not present

## 2020-03-29 DIAGNOSIS — R4020 Unspecified coma: Secondary | ICD-10-CM | POA: Diagnosis not present

## 2020-03-29 DIAGNOSIS — M069 Rheumatoid arthritis, unspecified: Secondary | ICD-10-CM | POA: Insufficient documentation

## 2020-03-29 DIAGNOSIS — I480 Paroxysmal atrial fibrillation: Secondary | ICD-10-CM | POA: Diagnosis not present

## 2020-03-29 DIAGNOSIS — Z79899 Other long term (current) drug therapy: Secondary | ICD-10-CM | POA: Insufficient documentation

## 2020-03-29 DIAGNOSIS — I959 Hypotension, unspecified: Secondary | ICD-10-CM | POA: Diagnosis not present

## 2020-03-29 DIAGNOSIS — R519 Headache, unspecified: Secondary | ICD-10-CM | POA: Diagnosis not present

## 2020-03-29 DIAGNOSIS — S99922A Unspecified injury of left foot, initial encounter: Secondary | ICD-10-CM | POA: Diagnosis not present

## 2020-03-29 LAB — BASIC METABOLIC PANEL
Anion gap: 14 (ref 5–15)
BUN: 27 mg/dL — ABNORMAL HIGH (ref 8–23)
CO2: 19 mmol/L — ABNORMAL LOW (ref 22–32)
Calcium: 9 mg/dL (ref 8.9–10.3)
Chloride: 99 mmol/L (ref 98–111)
Creatinine, Ser: 1.44 mg/dL — ABNORMAL HIGH (ref 0.44–1.00)
GFR calc Af Amer: 39 mL/min — ABNORMAL LOW (ref >60)
GFR calc non Af Amer: 34 mL/min — ABNORMAL LOW (ref >60)
Glucose, Bld: 120 mg/dL — ABNORMAL HIGH (ref 70–99)
Potassium: 3.5 mmol/L (ref 3.5–5.1)
Sodium: 132 mmol/L — ABNORMAL LOW (ref 135–145)

## 2020-03-29 LAB — CBC
HCT: 32.8 % — ABNORMAL LOW (ref 36.0–46.0)
Hemoglobin: 10.5 g/dL — ABNORMAL LOW (ref 12.0–15.0)
MCH: 29.1 pg (ref 26.0–34.0)
MCHC: 32 g/dL (ref 30.0–36.0)
MCV: 90.9 fL (ref 80.0–100.0)
Platelets: 198 10*3/uL (ref 150–400)
RBC: 3.61 MIL/uL — ABNORMAL LOW (ref 3.87–5.11)
RDW: 14.9 % (ref 11.5–15.5)
WBC: 7.5 10*3/uL (ref 4.0–10.5)
nRBC: 0 % (ref 0.0–0.2)

## 2020-03-29 LAB — SARS CORONAVIRUS 2 BY RT PCR (HOSPITAL ORDER, PERFORMED IN ~~LOC~~ HOSPITAL LAB): SARS Coronavirus 2: NEGATIVE

## 2020-03-29 LAB — CBG MONITORING, ED: Glucose-Capillary: 125 mg/dL — ABNORMAL HIGH (ref 70–99)

## 2020-03-29 LAB — TROPONIN I (HIGH SENSITIVITY): Troponin I (High Sensitivity): 7 ng/L (ref <18)

## 2020-03-29 MED ORDER — SODIUM CHLORIDE 0.9 % IV BOLUS
1000.0000 mL | Freq: Once | INTRAVENOUS | Status: AC
  Administered 2020-03-29: 1000 mL via INTRAVENOUS

## 2020-03-29 MED ORDER — SODIUM CHLORIDE 0.9 % IV BOLUS: 1000.0000 mL | Freq: Once | INTRAVENOUS | Status: DC

## 2020-03-29 MED ORDER — ONDANSETRON HCL 4 MG/2ML IJ SOLN
4.0000 mg | Freq: Once | INTRAMUSCULAR | Status: AC
  Administered 2020-03-29: 4 mg via INTRAVENOUS
  Filled 2020-03-29: qty 2

## 2020-03-29 NOTE — ED Provider Notes (Signed)
[facility] EMERGENCY DEPARTMENT Provider Note   CSN: [id number] Arrival date & time: 03/29/20  1722     History Chief Complaint  Patient presents with  . Loss of Consciousness    Loretta Hunt is a 79 y.o. female.  HPI   79 year old female with a history of GERD, heart murmur, hypertension, hypothyroidism, PUD, RA, Schatzki's ring, who presents to the emergency department today for evaluation of a syncopal episode.  Family states that patient was sitting at a table eating food when she suddenly lost consciousness.  The person with her stated that she had some gurgling but did not have any generalized shaking or seizure-like activity.  Patient states just prior to this she felt very warm.  She denies any preceding chest pain, palpitations, or other symptoms.  Following the episode she did have several episodes of vomiting and urinated on herself.  At present she states she feels back to baseline.  She denies any chest pain shortness of breath or other symptoms  She does state that she had a fall approximately 2 days ago after tripping over her shoe.  She hit her head on a door and then fell to the ground.  She did not lose consciousness.  She has not had a headache.  She denies any visual changes, vomiting, or unilateral numbness/weakness.  She is complaining of pain to the left foot, right knee.  She was seen by her PCP who has been titrating her blood pressure medications.  They decreased one of her blood pressure medications today.  Past Medical History:  Diagnosis Date  . GERD (gastroesophageal reflux disease)   . Heart murmur   . Hx of adenomatous colonic polyps   . Hypertension   . Hypothyroidism   . PUD (peptic ulcer disease) 1994   h.pylori  . Rheumatoid arthritis(714.0)       . Schatzki's ring 2004   s/p dilation    Patient Active Problem List   Diagnosis Date Noted  . Colon adenomas 07/30/2011    Class: History of  . Esophageal dysphagia 07/30/2011  .  Constipation, chronic 07/30/2011    Past Surgical History:  Procedure Laterality Date  . ABDOMINAL HYSTERECTOMY    . CATARACT EXTRACTION W/PHACO Right 02/05/2016   Procedure: CATARACT EXTRACTION PHACO AND INTRAOCULAR LENS PLACEMENT (IOC);  Surgeon: Tonny Branch, MD;  Location: [facility] ORS;  Service: Ophthalmology;  Laterality: Right;  CDE 12.32  . CATARACT EXTRACTION W/PHACO Left 02/22/2016   Procedure: CATARACT EXTRACTION PHACO AND INTRAOCULAR LENS PLACEMENT (IOC);  Surgeon: Tonny Branch, MD;  Location: [facility];  Service: Ophthalmology;  Laterality: Left;  CDE: 16.94   . COLONOSCOPY  08/2006   Anal papilloma, internal hemorrhoids. Recommended to have repeat colonoscopy in November 2012  . COLONOSCOPY  2004   Melanosis coli (mild), couple of scattered sigmoid diverticula, small polyps and a descending colon. Adenomatous polyps.  . COLONOSCOPY  08/26/2011   Procedure: COLONOSCOPY;  Surgeon: Daneil Dolin, MD;  Location: [facility] SUITE;  Service: Endoscopy;  Laterality: N/A;  8:30  . ESOPHAGOGASTRODUODENOSCOPY  2004   Mild ulcerative reflux esophagitis, Schatzki ring dilated and disrupted, small hiatal hernia  . FOOT SURGERY Left    fracture  . knee replacement surgery     bilateral, 1994/1995     OB History   No obstetric history on file.     Family History  Problem Relation Age of Onset  . Stomach cancer Father   . Colon cancer Neg Hx  Social History   Tobacco Use  . Smoking status: Former Smoker    Packs/day: 0.50    Years: 18.00    Pack years: 9.00    Types: Cigarettes    Quit date: 12/13/1992    Years since quitting: 27.3  . Smokeless tobacco: Former Systems developer    Quit date: 12/11/1992  Substance Use Topics  . Alcohol use: Yes    Comment: a pint a week. None since 1994.  . Drug use: No    Home Medications Prior to Admission medications   Medication Sig Start Date End Date Taking? Authorizing Provider  amLODipine (NORVASC) 10 MG tablet Take 10 mg by mouth at bedtime.  03/29/20   Yes [provider]  Ascorbic Acid (VITAMIN C) 1000 MG tablet Take 1,000 mg by mouth every morning.    Yes [provider]  aspirin 81 MG tablet Take 81 mg by mouth every morning.    Yes [provider]  Cholecalciferol (VITAMIN D3) 5000 UNITS TABS Take 1 tablet by mouth daily.   Yes [provider]  fish oil-omega-3 fatty acids 1000 MG capsule Take 1 g by mouth daily.    Yes [provider]  furosemide (LASIX) 20 MG tablet Take 20 mg by mouth daily as needed for fluid or edema.   Yes [provider]  hydrochlorothiazide (HYDRODIURIL) 25 MG tablet Take 25 mg by mouth every morning.  03/29/20  Yes [provider]  levothyroxine (SYNTHROID, LEVOTHROID) 100 MCG tablet Take 100 mcg by mouth every morning.  07/08/11  Yes [provider]  lisinopril (PRINIVIL,ZESTRIL) 20 MG tablet Take 10 mg by mouth every morning.    Yes [provider]  metoprolol succinate (TOPROL-XL) 25 MG 24 hr tablet Take 25 mg by mouth every morning.    Yes [provider]  Multiple Vitamins-Minerals (MULTIVITAMINS THER. W/MINERALS) TABS Take 1 tablet by mouth every morning.    Yes [provider]    Allergies    Patient has no known allergies.  Review of Systems   Review of Systems  Constitutional: Positive for diaphoresis. Negative for fever.  HENT: Negative for ear pain and sore throat.   Eyes: Negative for pain and visual disturbance.  Respiratory: Negative for cough and shortness of breath.   Cardiovascular: Negative for chest pain.  Gastrointestinal: Positive for nausea and vomiting. Negative for abdominal pain, constipation and diarrhea.  Genitourinary: Negative for dysuria and hematuria.       Loss of control of bladder function  Musculoskeletal:       Left foot pain, right knee pain  Skin: Negative for rash.  Neurological: Positive for syncope. Negative for dizziness, seizures, weakness, light-headedness and  numbness.  All other systems reviewed and are negative.   Physical Exam Updated Vital Signs BP 134/67   Pulse 63   Temp 97.7 F (36.5 C) (Oral)   Resp 14   Ht 6' (1.829 m)   Wt 100.2 kg   SpO2 99%   BMI 29.97 kg/m   Physical Exam Vitals and nursing note reviewed.  Constitutional:      General: She is not in acute distress.    Appearance: She is well-developed.  HENT:     Head: Normocephalic and atraumatic.  Eyes:     Conjunctiva/sclera: Conjunctivae normal.  Cardiovascular:     Rate and Rhythm: Normal rate and regular rhythm.     Heart sounds: No murmur.  Pulmonary:     Effort: Pulmonary effort is normal. No respiratory  distress.     Breath sounds: Normal breath sounds.  Abdominal:     Palpations: Abdomen is soft.     Tenderness: There is no abdominal tenderness.  Musculoskeletal:     Cervical back: Neck supple.     Comments: TTP to the right knee, left foot. No ttp to the CTL spine.   Skin:    General: Skin is warm and dry.  Neurological:     Mental Status: She is alert.     Comments: Mental Status:  Alert, thought content appropriate, able to give a coherent history. Speech fluent without evidence of aphasia. Able to follow 2 step commands without difficulty.  Cranial Nerves:  II: pupils equal, round, reactive to light III,IV, VI: ptosis not present, extra-ocular motions intact bilaterally  V,VII: smile symmetric, facial light touch sensation equal VIII: hearing grossly normal to voice  X: uvula elevates symmetrically  XI: bilateral shoulder shrug symmetric and strong XII: midline tongue extension without fassiculations Motor:  Normal tone. 5/5 strength of BUE and BLE major muscle groups including strong and equal grip strength and dorsiflexion/plantar flexion Sensory: light touch normal in all extremities.      ED Results / Procedures / Treatments   Labs (all labs ordered are listed, but only abnormal results are displayed) Labs Reviewed  BASIC  METABOLIC PANEL - Abnormal; Notable for the following components:      Result Value   Sodium 132 (*)    CO2 19 (*)    Glucose, Bld 120 (*)    BUN 27 (*)    Creatinine, Ser 1.44 (*)    GFR calc non Af Amer 34 (*)    GFR calc Af Amer 39 (*)    All other components within normal limits  CBC - Abnormal; Notable for the following components:   RBC 3.61 (*)    Hemoglobin 10.5 (*)    HCT 32.8 (*)    All other components within normal limits  CBG MONITORING, ED - Abnormal; Notable for the following components:   Glucose-Capillary 125 (*)    All other components within normal limits  SARS CORONAVIRUS 2 BY RT PCR (HOSPITAL ORDER, [place] [facility])  URINALYSIS, ROUTINE W REFLEX MICROSCOPIC  TROPONIN I (HIGH SENSITIVITY)    EKG EKG Interpretation  Date/Time:  Wednesday March 29 2020 17:35:43 EDT Ventricular Rate:  81 PR Interval:    QRS Duration: 93 QT Interval:  400 QTC Calculation: 465 R Axis:   -21 Text Interpretation: Atrial fibrillation Abnormal R-wave progression, late transition Inferior infarct, old Since last tracing afib is now present Nonspecific T wave abnormality Confirmed by Calvert Cantor [phone]) on 03/29/2020 5:52:15 PM   Radiology CT Head Wo Contrast  Result Date: 03/29/2020 CLINICAL DATA:  Syncope, headache, posttraumatic EXAM: CT HEAD WITHOUT CONTRAST TECHNIQUE: Contiguous axial images were obtained from the base of the skull through the vertex without intravenous contrast. COMPARISON:  MRI 10/06/2015 FINDINGS: Brain: Mild age related volume loss. Old right cerebellar infarct. No acute intracranial abnormality. Specifically, no hemorrhage, hydrocephalus, mass lesion, acute infarction, or significant intracranial injury. Vascular: No hyperdense vessel or unexpected calcification. Skull: No acute calvarial abnormality. Sinuses/Orbits: No acute findings. Mucosal thickening in the right sphenoid sinus and posterior right ethmoid air cells. Other: None  IMPRESSION: No acute intracranial abnormality. Electronically Signed   By: Rolm Baptise M.D.   On: 03/29/2020 19:16   CT Cervical Spine Wo Contrast  Result Date: 03/29/2020 CLINICAL DATA:  Syncope, fall EXAM: CT CERVICAL SPINE  WITHOUT CONTRAST TECHNIQUE: Multidetector CT imaging of the cervical spine was performed without intravenous contrast. Multiplanar CT image reconstructions were also generated. COMPARISON:  None. FINDINGS: Alignment: No subluxation. Skull base and vertebrae: No acute fracture. No primary bone lesion or focal pathologic process. Soft tissues and spinal canal: No prevertebral fluid or swelling. No visible canal hematoma. Disc levels:  Early degenerative disc and facet disease. Upper chest: No acute findings Other: None IMPRESSION: No acute bony abnormality. Electronically Signed   By: Rolm Baptise M.D.   On: 03/29/2020 19:17   DG Chest Portable 1 View  Result Date: 03/29/2020 CLINICAL DATA:  Recent syncopal episodes EXAM: PORTABLE CHEST 1 VIEW COMPARISON:  10/10/2015 FINDINGS: Cardiac shadow is mildly prominent. Aortic calcifications are seen. The lungs are well aerated bilaterally. Mild patchy basilar atelectasis is seen. No effusion is noted. No acute bony abnormality is noted. IMPRESSION: Mild bibasilar atelectatic changes. Electronically Signed   By: Inez Catalina M.D.   On: 03/29/2020 22:01   DG Knee Complete 4 Views Right  Result Date: 03/29/2020 CLINICAL DATA:  Fall EXAM: RIGHT KNEE - COMPLETE 4+ VIEW COMPARISON:  None FINDINGS: Changes of right knee replacement. No joint effusion. No acute bony abnormality. Specifically, no fracture, subluxation, or dislocation. IMPRESSION: Right knee replacement.  No acute bony abnormality. Electronically Signed   By: Rolm Baptise M.D.   On: 03/29/2020 19:14   DG Foot Complete Left  Result Date: 03/29/2020 CLINICAL DATA:  Fall EXAM: LEFT FOOT - COMPLETE 3+ VIEW COMPARISON:  None. FINDINGS: Screw seen within the distal left talus. Advanced  degenerative changes at the talonavicular joint. No acute fracture, subluxation or dislocation. Plantar calcaneal spur. Soft tissues are intact. IMPRESSION: No acute bony abnormality. Electronically Signed   By: Rolm Baptise M.D.   On: 03/29/2020 19:15    Procedures Procedures (including critical care time)  Medications Ordered in ED Medications  ondansetron (ZOFRAN) injection 4 mg (4 mg Intravenous Given 03/29/20 1907)  sodium chloride 0.9 % bolus 1,000 mL (0 mLs Intravenous Stopped 03/29/20 2010)    ED Course  I have reviewed the triage vital signs and the nursing notes.  Pertinent labs & imaging results that were available during my care of the patient were reviewed by me and considered in my medical decision making (see chart for details).    MDM Rules/Calculators/A&P                      79 year old female presenting for evaluation of syncopal episode that occurred while sitting prior to arrival.  Did not have any significant prodrome.  She feels back to baseline now.  Did have fall 2 days ago and hit head.  Complaining of left foot and right knee pain.  Vital signs are reassuring.  She does not have any evidence of orthostatic hypotension.  She has mild anemia  Reviewed/interpreted labs CBC is without leukocytosis BMP with CO2 19, normal anion gap.  Mildly elevated BUN/creatinine 27 and 1.44 which is worse from prior. Troponin is negative UA is pending at the time of admission Covid is negative  EKG with new onset A. fib, rate controlled. abnormal R-wave progression, late transition Inferior infarct, old Since last tracing afib is now present Nonspecific T wave abnormality   - CHADsVASc 4  - no indication for cardioversion as time of onset Is unknown and pt is hemodynamically stable  X-ray left foot negative for acute traumatic injury X-ray right knee negative for acute traumatic injury CT head/cervical  spine negative for acute traumatic injury  Given elevated age and no  specific prodrome of symptoms there is concern for cardiac etiology of her syncope.  We will plan for admission.  CONSULT with Dr. Chancy Milroy who accepts patient for admission.   Final Clinical Impression(s) / ED Diagnoses Final diagnoses:  Syncope, unspecified syncope type    Rx / DC Orders ED Discharge Orders    None       Bishop Dublin 03/29/20 2235    Truddie Hidden, MD 03/29/20 2310

## 2020-03-29 NOTE — ED Notes (Signed)
Radiology at bedside

## 2020-03-29 NOTE — ED Triage Notes (Signed)
Pt brought in by EMS due to syncopal episode. Pt was sitting at table drinking water and fell forward losing consciousness. EMS witnessed another syncopal episode lasting approx 2 mins. Sys in the 70's Pt vomited and urinated on self. Pt fell yesterday due to tripping over shoe and hit head over left brow. Pt went to dr yesterday and BP med lisinopril was discontinued. Pt states she did not have it today

## 2020-03-30 ENCOUNTER — Observation Stay (HOSPITAL_BASED_OUTPATIENT_CLINIC_OR_DEPARTMENT_OTHER): Payer: Medicare Other

## 2020-03-30 DIAGNOSIS — R55 Syncope and collapse: Secondary | ICD-10-CM

## 2020-03-30 DIAGNOSIS — I4891 Unspecified atrial fibrillation: Secondary | ICD-10-CM | POA: Diagnosis present

## 2020-03-30 LAB — CBC
HCT: 30.2 % — ABNORMAL LOW (ref 36.0–46.0)
Hemoglobin: 10 g/dL — ABNORMAL LOW (ref 12.0–15.0)
MCH: 29.7 pg (ref 26.0–34.0)
MCHC: 33.1 g/dL (ref 30.0–36.0)
MCV: 89.6 fL (ref 80.0–100.0)
Platelets: 191 10*3/uL (ref 150–400)
RBC: 3.37 MIL/uL — ABNORMAL LOW (ref 3.87–5.11)
RDW: 15 % (ref 11.5–15.5)
WBC: 5.6 10*3/uL (ref 4.0–10.5)
nRBC: 0 % (ref 0.0–0.2)

## 2020-03-30 LAB — URINALYSIS, ROUTINE W REFLEX MICROSCOPIC
Bilirubin Urine: NEGATIVE
Glucose, UA: NEGATIVE mg/dL
Hgb urine dipstick: NEGATIVE
Ketones, ur: NEGATIVE mg/dL
Leukocytes,Ua: NEGATIVE
Nitrite: NEGATIVE
Protein, ur: NEGATIVE mg/dL
Specific Gravity, Urine: 1.009 (ref 1.005–1.030)
pH: 6 (ref 5.0–8.0)

## 2020-03-30 LAB — MAGNESIUM: Magnesium: 1.8 mg/dL (ref 1.7–2.4)

## 2020-03-30 LAB — BASIC METABOLIC PANEL
Anion gap: 10 (ref 5–15)
BUN: 21 mg/dL (ref 8–23)
CO2: 23 mmol/L (ref 22–32)
Calcium: 9.1 mg/dL (ref 8.9–10.3)
Chloride: 101 mmol/L (ref 98–111)
Creatinine, Ser: 1.13 mg/dL — ABNORMAL HIGH (ref 0.44–1.00)
GFR calc Af Amer: 53 mL/min — ABNORMAL LOW (ref >60)
GFR calc non Af Amer: 46 mL/min — ABNORMAL LOW (ref >60)
Glucose, Bld: 100 mg/dL — ABNORMAL HIGH (ref 70–99)
Potassium: 3.8 mmol/L (ref 3.5–5.1)
Sodium: 134 mmol/L — ABNORMAL LOW (ref 135–145)

## 2020-03-30 LAB — ECHOCARDIOGRAM COMPLETE
Height: 72 in
Weight: 3536 oz

## 2020-03-30 LAB — TSH: TSH: 3.929 u[IU]/mL (ref 0.350–4.500)

## 2020-03-30 MED ORDER — ACETAMINOPHEN 325 MG PO TABS: 650.0000 mg | ORAL_TABLET | Freq: Four times a day (QID) | ORAL | Status: DC | PRN

## 2020-03-30 MED ORDER — ONDANSETRON HCL 4 MG/2ML IJ SOLN: 4.0000 mg | Freq: Four times a day (QID) | INTRAMUSCULAR | Status: DC | PRN

## 2020-03-30 MED ORDER — HEPARIN SODIUM (PORCINE) 5000 UNIT/ML IJ SOLN
5000.0000 [IU] | Freq: Three times a day (TID) | INTRAMUSCULAR | Status: DC
  Administered 2020-03-30: 5000 [IU] via SUBCUTANEOUS
  Filled 2020-03-30: qty 1

## 2020-03-30 MED ORDER — SODIUM CHLORIDE 0.9 % IV SOLN: INTRAVENOUS | Status: DC

## 2020-03-30 MED ORDER — ONDANSETRON HCL 4 MG PO TABS: 4.0000 mg | ORAL_TABLET | Freq: Four times a day (QID) | ORAL | Status: DC | PRN

## 2020-03-30 MED ORDER — APIXABAN 5 MG PO TABS: 5.0000 mg | ORAL_TABLET | Freq: Two times a day (BID) | ORAL | 3 refills | Status: AC

## 2020-03-30 MED ORDER — ACETAMINOPHEN 650 MG RE SUPP: 650.0000 mg | Freq: Four times a day (QID) | RECTAL | Status: DC | PRN

## 2020-03-30 NOTE — H&P (Signed)
History and Physical    Loretta Hunt [id number] DOB: 1941-06-20 DOA: 03/29/2020  PCP: Aretta Nip, MD (Confirm with patient/family/NH records and if not entered, this has to be entered at [facility] point of entry) Patient coming from: Home  I have personally briefly reviewed patient's old medical records in [place]  Chief Complaint: Loss of consciousness  HPI: Loretta Hunt is a 79 y.o. female with medical history significant of hypertension, hypothyroidism, GERD, heart murmur, rheumatoid arthritis and Schatzki's ring is brought to ED by EMS for evaluation of syncopal episode.  Patient's grandson is also present at the bedside during my evaluation was helping with HPI questions and patient's daughter was on phone.  Patient's granddaughter stated that she was eating more food when suddenly she lost her consciousness and they called the EMS.  Patient regained the consciousness and she was very confused.  When the EMS arrived patient had another episode of syncope witnessed by EMS and she lost her consciousness for at least a minute.  During that.  She lost her control over urination and he also had several episodes of vomiting but did not have any shaking or seizure-like activity.  Patient is awake alert and oriented at this time and denies any fever, chills, chest pain, difficulty with breathing, nausea, vomiting, abdominal pain.  Patient is only complaining of pain in her right leg.  It was also reported by the grandson that patient had a fall yesterday and she hit her head but she did not lose any consciousness.  The grandson also mentioned that patient recently had multiple episodes of hypotension and her blood pressure medication is discontinued by the PCP.  ED Course: On arrival to the ED, patient had blood pressure of 134/67, heart rate 63, respiratory rate 14 and oxygen saturation 99% on room air.  Work showed WBC 7.5, hemoglobin 10.5, sodium 132, potassium 3.5, BUN 27,  creatinine 1.4 and blood glucose 120.  Covid test was negative.  CT head negative for acute intracranial bleed or pathology.  CT cervical spine was also negative for any fracture or dislocation.  Showed atrial fibrillation with abnormal R wave progression, heart rate is 81.  Late transition inferior infarct.  Nonspecific T wave abnormality.  Patient was given a bolus of normal saline in the ED also a dose of IV Zofran.  Review of Systems: As per HPI otherwise 10 point review of systems negative.  Unacceptable ROS statements: "10 systems reviewed," "Extensive" (without elaboration).  Acceptable ROS statements: "All others negative," "All others reviewed and are negative," and "All others unremarkable," with at [place] documented Can't double dip - if using for HPI can't use for ROS  Past Medical History:  Diagnosis Date  . GERD (gastroesophageal reflux disease)   . Heart murmur   . Hx of adenomatous colonic polyps   . Hypertension   . Hypothyroidism   . PUD (peptic ulcer disease) 1994   h.pylori  . Rheumatoid arthritis(714.0)       . Schatzki's ring 2004   s/p dilation    Past Surgical History:  Procedure Laterality Date  . ABDOMINAL HYSTERECTOMY    . CATARACT EXTRACTION W/PHACO Right 02/05/2016   Procedure: CATARACT EXTRACTION PHACO AND INTRAOCULAR LENS PLACEMENT (IOC);  Surgeon: Tonny Branch, MD;  Location: [facility];  Service: Ophthalmology;  Laterality: Right;  CDE 12.32  . CATARACT EXTRACTION W/PHACO Left 02/22/2016   Procedure: CATARACT EXTRACTION PHACO AND INTRAOCULAR LENS PLACEMENT (IOC);  Surgeon: Tonny Branch, MD;  Location: [facility] ORS;  Service: Ophthalmology;  Laterality: Left;  CDE: 16.94   . COLONOSCOPY  08/2006   Anal papilloma, internal hemorrhoids. Recommended to have repeat colonoscopy in November 2012  . COLONOSCOPY  2004   Melanosis coli (mild), couple of scattered sigmoid diverticula, small polyps and a descending colon. Adenomatous polyps.  . COLONOSCOPY  08/26/2011    Procedure: COLONOSCOPY;  Surgeon: Daneil Dolin, MD;  Location: [facility] SUITE;  Service: Endoscopy;  Laterality: N/A;  8:30  . ESOPHAGOGASTRODUODENOSCOPY  2004   Mild ulcerative reflux esophagitis, Schatzki ring dilated and disrupted, small hiatal hernia  . FOOT SURGERY Left    fracture  . knee replacement surgery     bilateral, 1994/1995     reports that she quit smoking about 27 years ago. Her smoking use included cigarettes. She has a 9.00 pack-year smoking history. She quit smokeless tobacco use about 27 years ago. She reports current alcohol use. She reports that she does not use drugs.  No Known Allergies  Family History  Problem Relation Age of Onset  . Stomach cancer Father   . Colon cancer Neg Hx     Unacceptable: Noncontributory, unremarkable, or negative. Acceptable: (example)Family history negative for heart disease  Prior to Admission medications   Medication Sig Start Date End Date Taking? Authorizing Provider  amLODipine (NORVASC) 10 MG tablet Take 10 mg by mouth at bedtime.  03/29/20  Yes [provider]  Ascorbic Acid (VITAMIN C) 1000 MG tablet Take 1,000 mg by mouth every morning.    Yes [provider]  aspirin 81 MG tablet Take 81 mg by mouth every morning.    Yes [provider]  Cholecalciferol (VITAMIN D3) 5000 UNITS TABS Take 1 tablet by mouth daily.   Yes [provider]  fish oil-omega-3 fatty acids 1000 MG capsule Take 1 g by mouth daily.    Yes [provider]  furosemide (LASIX) 20 MG tablet Take 20 mg by mouth daily as needed for fluid or edema.   Yes [provider]  hydrochlorothiazide (HYDRODIURIL) 25 MG tablet Take 25 mg by mouth every morning.  03/29/20  Yes [provider]  levothyroxine (SYNTHROID, LEVOTHROID) 100 MCG tablet Take 100 mcg by mouth every morning.  07/08/11  Yes [provider]  lisinopril (PRINIVIL,ZESTRIL) 20 MG tablet Take 10 mg by mouth every morning.    Yes  [provider]  metoprolol succinate (TOPROL-XL) 25 MG 24 hr tablet Take 25 mg by mouth every morning.    Yes [provider]  Multiple Vitamins-Minerals (MULTIVITAMINS THER. W/MINERALS) TABS Take 1 tablet by mouth every morning.    Yes [provider]    Physical Exam: Vitals:   03/30/20 0530 03/30/20 0545 03/30/20 0600 03/30/20 0630  BP:   (!) 90/56 (!) 109/56  Pulse:  66    Resp: (!) 23 13 17 13   Temp:      TempSrc:      SpO2:  99%    Weight:      Height:        Constitutional: NAD, calm, comfortable Vitals:   03/30/20 0530 03/30/20 0545 03/30/20 0600 03/30/20 0630  BP:   (!) 90/56 (!) 109/56  Pulse:  66    Resp: (!) 23 13 17 13   Temp:      TempSrc:      SpO2:  99%    Weight:      Height:       Eyes: PERRL, lids and  conjunctivae normal ENMT: Mucous membranes are moist. Posterior pharynx clear of any exudate or lesions.Normal dentition.  Neck: normal, supple, no masses, no thyromegaly Respiratory: clear to auscultation bilaterally, no wheezing, no crackles. Normal respiratory effort. No accessory muscle use.  Cardiovascular: Chest nontender on palpation.  Irregularly irregular rhythm with normal heart rate.  No extremity edema. 2+ pedal pulses. No carotid bruits.  Abdomen: no tenderness, no masses palpated. No hepatosplenomegaly. Bowel sounds positive.  Musculoskeletal: no clubbing / cyanosis. No joint deformity upper and lower extremities. Good ROM, no contractures. Normal muscle tone.  Skin: no rashes, lesions, ulcers. No induration Neurologic: CN 2-12 grossly intact. Sensation intact, DTR normal. Strength 5/5 in all 4.  Psychiatric: Normal judgment and insight. Alert and oriented x 3. Normal mood.   (Anything < 9 systems with 2 bullets each down codes to level 1) (If patient refuses exam can't bill higher level) (Make sure to document decubitus ulcers present on admission -- if possible -- and whether patient has chronic indwelling catheter  at time of admission)  Labs on Admission: I have personally reviewed following labs and imaging studies  CBC: Recent Labs  Lab 03/29/20 1825 03/30/20 0401  WBC 7.5 5.6  HGB 10.5* 10.0*  HCT 32.8* 30.2*  MCV 90.9 89.6  PLT 198 [id number]   Basic Metabolic Panel: Recent Labs  Lab 03/29/20 1825 03/30/20 0401  NA 132* 134*  K 3.5 3.8  CL 99 101  CO2 19* 23  GLUCOSE 120* 100*  BUN 27* 21  CREATININE 1.44* 1.13*  CALCIUM 9.0 9.1   GFR: Estimated Creatinine Clearance: 51.7 mL/min (A) (by C-G formula based on SCr of 1.13 mg/dL (H)). Liver Function Tests: No results for input(s): AST, ALT, ALKPHOS, BILITOT, PROT, ALBUMIN in the last 168 hours. No results for input(s): LIPASE, AMYLASE in the last 168 hours. No results for input(s): AMMONIA in the last 168 hours. Coagulation Profile: No results for input(s): INR, PROTIME in the last 168 hours. Cardiac Enzymes: No results for input(s): CKTOTAL, CKMB, CKMBINDEX, TROPONINI in the last 168 hours. BNP (last 3 results) No results for input(s): PROBNP in the last 8760 hours. HbA1C: No results for input(s): HGBA1C in the last 72 hours. CBG: Recent Labs  Lab 03/29/20 1901  GLUCAP 125*   Lipid Profile: No results for input(s): CHOL, HDL, LDLCALC, TRIG, CHOLHDL, LDLDIRECT in the last 72 hours. Thyroid Function Tests: No results for input(s): TSH, T4TOTAL, FREET4, T3FREE, THYROIDAB in the last 72 hours. Anemia Panel: No results for input(s): VITAMINB12, FOLATE, FERRITIN, TIBC, IRON, RETICCTPCT in the last 72 hours. Urine analysis:    Component Value Date/Time   COLORURINE YELLOW 10/06/2015 1128   APPEARANCEUR CLEAR 10/06/2015 1128   LABSPEC 1.005 10/06/2015 1128   PHURINE 7.0 10/06/2015 1128   GLUCOSEU NEGATIVE 10/06/2015 1128   HGBUR NEGATIVE 10/06/2015 1128   BILIRUBINUR NEGATIVE 10/06/2015 1128   KETONESUR NEGATIVE 10/06/2015 1128   PROTEINUR NEGATIVE 10/06/2015 1128   NITRITE NEGATIVE 10/06/2015 1128   LEUKOCYTESUR NEGATIVE  10/06/2015 1128    Radiological Exams on Admission: CT Head Wo Contrast  Result Date: 03/29/2020 CLINICAL DATA:  Syncope, headache, posttraumatic EXAM: CT HEAD WITHOUT CONTRAST TECHNIQUE: Contiguous axial images were obtained from the base of the skull through the vertex without intravenous contrast. COMPARISON:  MRI 10/06/2015 FINDINGS: Brain: Mild age related volume loss. Old right cerebellar infarct. No acute intracranial abnormality. Specifically, no hemorrhage, hydrocephalus, mass lesion, acute infarction, or significant intracranial injury. Vascular: No hyperdense vessel or unexpected calcification. Skull: No  acute calvarial abnormality. Sinuses/Orbits: No acute findings. Mucosal thickening in the right sphenoid sinus and posterior right ethmoid air cells. Other: None IMPRESSION: No acute intracranial abnormality. Electronically Signed   By: Rolm Baptise M.D.   On: 03/29/2020 19:16   CT Cervical Spine Wo Contrast  Result Date: 03/29/2020 CLINICAL DATA:  Syncope, fall EXAM: CT CERVICAL SPINE WITHOUT CONTRAST TECHNIQUE: Multidetector CT imaging of the cervical spine was performed without intravenous contrast. Multiplanar CT image reconstructions were also generated. COMPARISON:  None. FINDINGS: Alignment: No subluxation. Skull base and vertebrae: No acute fracture. No primary bone lesion or focal pathologic process. Soft tissues and spinal canal: No prevertebral fluid or swelling. No visible canal hematoma. Disc levels:  Early degenerative disc and facet disease. Upper chest: No acute findings Other: None IMPRESSION: No acute bony abnormality. Electronically Signed   By: Rolm Baptise M.D.   On: 03/29/2020 19:17   DG Chest Portable 1 View  Result Date: 03/29/2020 CLINICAL DATA:  Recent syncopal episodes EXAM: PORTABLE CHEST 1 VIEW COMPARISON:  10/10/2015 FINDINGS: Cardiac shadow is mildly prominent. Aortic calcifications are seen. The lungs are well aerated bilaterally. Mild patchy basilar atelectasis  is seen. No effusion is noted. No acute bony abnormality is noted. IMPRESSION: Mild bibasilar atelectatic changes. Electronically Signed   By: Inez Catalina M.D.   On: 03/29/2020 22:01   DG Knee Complete 4 Views Right  Result Date: 03/29/2020 CLINICAL DATA:  Fall EXAM: RIGHT KNEE - COMPLETE 4+ VIEW COMPARISON:  None FINDINGS: Changes of right knee replacement. No joint effusion. No acute bony abnormality. Specifically, no fracture, subluxation, or dislocation. IMPRESSION: Right knee replacement.  No acute bony abnormality. Electronically Signed   By: Rolm Baptise M.D.   On: 03/29/2020 19:14   DG Foot Complete Left  Result Date: 03/29/2020 CLINICAL DATA:  Fall EXAM: LEFT FOOT - COMPLETE 3+ VIEW COMPARISON:  None. FINDINGS: Screw seen within the distal left talus. Advanced degenerative changes at the talonavicular joint. No acute fracture, subluxation or dislocation. Plantar calcaneal spur. Soft tissues are intact. IMPRESSION: No acute bony abnormality. Electronically Signed   By: Rolm Baptise M.D.   On: 03/29/2020 19:15      Assessment/Plan Principal Problem:   Syncope Patient admitted with continuous telemetry monitoring. CT head negative for acute intracranial bleed or pathology. Patient is hemodynamically stable with a blood pressure of 134/67 and heart rate of 63 at this time. No signs or symptoms of any infection. EKG showed new onset atrial fibrillation but her heart rate is within normal limits. Echocardiogram ordered  Active Problems:   New onset atrial fibrillation [facility]) Cardiology consult ordered for further recommendations about AV nodal blockers and anticoagulation.  Acute normocytic anemia Patient denies hematemesis, hematuria, hematochezia and melena. Continue to monitor CBC and will consider blood transfusion if hemoglobin drop below 7.  Acute kidney injury Continue IV normal saline at the rate of 75 mL/h. Continue to monitor creatinine level  Hypertension Patient  recently had multiple episodes of hypotension as per patient's family and her lisinopril was discontinued by her primary care physician Continue to hold blood pressure medication because of soft blood pressure.  Rest of the home medications will be started after confirmation by pharmacy.     DVT prophylaxis: Heparin Code Status: Full code Family Communication: Patient's grandson present at the bedside. Consults called: Cardiology consult ordered for new onset atrial fibrillation Admission status: Observation/telemetry   Edmonia Lynch MD [facility] Pager [phone]   If 7PM-7AM, please contact night-coverage  [email or web address] Password   03/30/2020, 7:23 AM

## 2020-03-30 NOTE — Care Management Obs Status (Signed)
[place] NOTIFICATION   Patient Details  Name: Loretta Hunt MRN: [id number] Date of Birth: [birth year]   Medicare Observation Status Notification Given:  Yes    [place], LCSW 03/30/2020, 10:27 AM

## 2020-03-30 NOTE — Progress Notes (Signed)
Consult request has been received. CSW attempting to follow up at present time  Dennard Vezina M. Damari Suastegui LCSWA Transitions of Care  Clinical Social Worker  Ph: [phone] 

## 2020-03-30 NOTE — TOC Benefit Eligibility Note (Signed)
Transition of Care [facility]) Benefit Eligibility Note    Patient Details  Name: CHANELL NADEAU MRN: [id number] Date of Birth: 09-Sep-1941   Medication/Dose: apixaban 5 MG Tabs tablet Commonly known as: ELIQUIS Take 1 tablet (5 mg total) by mouth 2 (two) times daily.  Covered?: Yes  Tier: 3 Drug  Prescription Coverage Preferred Pharmacy: [facility], [facility]  Spoke with Person/Company/Phone Number:: Earnest Bailey [employer] at [phone]  Co-Pay: $365 (deductible + copay)  Prior Approval: No  Deductible: Unmet($330)  Additional Notes: medication is covered under a $35 copay after deductible has been met    Tommy Medal Phone Number: 03/30/2020, 11:06 AM

## 2020-03-30 NOTE — Evaluation (Signed)
Physical Therapy Evaluation Patient Details Name: Loretta Hunt MRN: [id number] DOB: 11-Aug-1941 Today's Date: 03/30/2020   History of Present Illness  Loretta Hunt is a 79 y.o. female with medical history significant of hypertension, hypothyroidism, GERD, heart murmur, rheumatoid arthritis and Schatzki's ring is brought to ED by EMS for evaluation of syncopal episode.  Patient's grandson is also present at the bedside during my evaluation was helping with HPI questions and patient's daughter was on phone.  Patient's granddaughter stated that she was eating more food when suddenly she lost her consciousness and they called the EMS.  Patient regained the consciousness and she was very confused.  When the EMS arrived patient had another episode of syncope witnessed by EMS and she lost her consciousness for at least a minute.  During that.  She lost her control over urination and he also had several episodes of vomiting but did not have any shaking or seizure-like activity.  Patient is awake alert and oriented at this time and denies any fever, chills, chest pain, difficulty with breathing, nausea, vomiting, abdominal pain.  Patient is only complaining of pain in her right leg.  It was also reported by the grandson that patient had a fall yesterday and she hit her head but she did not lose any consciousness.  The grandson also mentioned that patient recently had multiple episodes of hypotension and her blood pressure medication is discontinued by the PCP.    Clinical Impression  Patient functioning near baseline for functional mobility and gait, had difficulty sit to standing from chair due to generalized weakness, demonstrated good tolerance/distance for ambulation in room/hallways without loss of balance.  Plan:  Patient to be discharged home today and discharged from physical therapy to care of nursing for ambulation daily as tolerated for length of stay.     Follow Up Recommendations Home health  PT;Supervision - Intermittent;Supervision for mobility/OOB    Equipment Recommendations  None recommended by PT    Recommendations for Other Services       Precautions / Restrictions Precautions Precautions: Fall Restrictions Weight Bearing Restrictions: No      Mobility  Bed Mobility Overal bed mobility: Modified Independent             General bed mobility comments: HOB raised  Transfers Overall transfer level: Needs assistance Equipment used: Rolling walker (2 wheeled) Transfers: Sit to/from [facility] Sit to Stand: Min guard;Min assist Stand pivot transfers: Supervision       General transfer comment: has difficulty sit to standing from chair due to weakness  Ambulation/Gait Ambulation/Gait assistance: Supervision Gait Distance (Feet): 100 Feet Assistive device: Rolling walker (2 wheeled) Gait Pattern/deviations: Decreased step length - right;Decreased step length - left;Decreased stride length Gait velocity: decreased   General Gait Details: slightly labored cadence without loss of balance  Stairs            Wheelchair Mobility    Modified Rankin (Stroke Patients Only)       Balance Overall balance assessment: Needs assistance Sitting-balance support: No upper extremity supported;Feet supported Sitting balance-Leahy Scale: Good Sitting balance - Comments: seated at EOB   Standing balance support: Bilateral upper extremity supported;During functional activity Standing balance-Leahy Scale: Fair Standing balance comment: using RW                             Pertinent Vitals/Pain Pain Assessment: No/denies pain    Home Living Family/patient expects to be discharged  to:: Private residence Living Arrangements: Children Available Help at Discharge: Family;Available 24 hours/day Type of Home: House Home Access: Ramped entrance     Home Layout: Two level;Full bath on main level;Able to live on main level with  bedroom/bathroom Home Equipment: Gilford Rile - 2 wheels;Cane - single point;Bedside commode      Prior Function Level of Independence: Independent with assistive device(s)         Comments: household and short distance community ambulator with [facility]     Hand Dominance        Extremity/Trunk Assessment   Upper Extremity Assessment Upper Extremity Assessment: Overall WFL for tasks assessed    Lower Extremity Assessment Lower Extremity Assessment: Generalized weakness    Cervical / Trunk Assessment Cervical / Trunk Assessment: Normal  Communication   Communication: No difficulties  Cognition Arousal/Alertness: Awake/alert Behavior During Therapy: WFL for tasks assessed/performed Overall Cognitive Status: Within Functional Limits for tasks assessed                                        General Comments      Exercises     Assessment/Plan    PT Assessment All further PT needs can be met in the next venue of care  PT Problem List Decreased strength;Decreased activity tolerance;Decreased balance;Decreased mobility       PT Treatment Interventions      PT Goals (Current goals can be found in the Care Plan section)  Acute Rehab PT Goals Patient Stated Goal: return home with family to assist PT Goal Formulation: With patient Time For Goal Achievement: 03/30/20 Potential to Achieve Goals: Good    Frequency     Barriers to discharge        Co-evaluation               AM-PAC PT "6 Clicks" Mobility  Outcome Measure Help needed turning from your back to your side while in a flat bed without using bedrails?: None Help needed moving from lying on your back to sitting on the side of a flat bed without using bedrails?: None Help needed moving to and from a bed to a chair (including a wheelchair)?: A Little Help needed standing up from a chair using your arms (e.g., wheelchair or bedside chair)?: A Little Help needed to walk in hospital room?: A  Little Help needed climbing 3-5 steps with a railing? : A Lot 6 Click Score: 19    End of Session   Activity Tolerance: Patient tolerated treatment well;Patient limited by fatigue Patient left: in bed;with call bell/phone within reach Nurse Communication: Mobility status PT Visit Diagnosis: Unsteadiness on feet (R26.81);Other abnormalities of gait and mobility (R26.89);Muscle weakness (generalized) (M62.81)    Time: 6015-6153 PT Time Calculation (min) (ACUTE ONLY): 26 min   Charges:   PT Evaluation $PT Eval Moderate Complexity: 1 Mod PT Treatments $Therapeutic Activity: 23-37 mins        2:16 PM, 03/30/20 Lonell Grandchild, MPT Physical Therapist with [facility] [phone] office [phone] mobile phone

## 2020-03-30 NOTE — ED Notes (Signed)
Patient had mistaken her phone in room as her call bell that was also on the bed. Patient had slide down to bottom of the bed and urinated in vomit bag and in the floor and bed. Patient was assisted out of bed to be cleaned up. Bed linens and gown changed. Patient cleaned and back in bed. Patient has pure wick at this time. Gave patient call bell in her hand and showed her what button to push for assistance. Patient acknowledge she knows how to use call bell.

## 2020-03-30 NOTE — Progress Notes (Signed)
*  PRELIMINARY RESULTS* Echocardiogram 2D Echocardiogram has been performed.  Leavy Cella 03/30/2020, 9:33 AM

## 2020-03-30 NOTE — Discharge Summary (Signed)
Physician Discharge Summary  MAEVRY PEDDLE [id number] DOB: 1941-05-30 DOA: 03/29/2020  PCP: Aretta Nip, MD  Admit date: 03/29/2020 Discharge date: 03/30/2020  Admitted From: Home  Disposition:  Home with [facility]   Recommendations for Outpatient Follow-up:  1. Follow up with PCP Dr. Radene Ou in 1-2 weeks for new Afib 2. Follow up with Dr. Johnsie Cancel in 1 week for new Afib     Home Health: PT/OT due to ongoing balance isseus and pain limiting ambulation  Equipment/Devices: None  Discharge Condition: Fair  CODE STATUS: FULL Diet recommendation: Cardiac  Brief/Interim Summary: Loretta Hunt is a 79 y.o. F with hx HTN, rheumatoid arthritis not on DMARD, and hypothyroidism who presented after syncope.  Patient was in her usual state of health until the evening prior to admission, she got up to get a glass of water, when she felt hot and sweaty, then passed out.  Per report she vomited, but had no micturition or seizure-like activity.  She had another syncopal episode with EMS.  In the ER blood pressure and heart rate normal.  Blood counts, renal function, electrolytes normal.  CT head and C-spine unremarkable.  Radiographs of the right knee and left foot were unremarkable.  Chest x-ray clear.  EKG showed new onset atrial fibrillation.     PRINCIPAL HOSPITAL DIAGNOSIS: Vasovagal syncope    Discharge Diagnoses:  Vasovagal syncope Patient had classic prodrome, syncope, and returned to baseline by the time of arrival to the ER.  She was observed overnight and feels completely to her baseline.  Atrial fibrillation, paroxysmal This is new onset, no prior history.  Asymptomatic.  No chest pain, signs of ischemia, or congestive heart failure.  CHA2DS2-VASc 4.  Started on Eliquis.  Rate controlled.  TSH normal.  Echocardiogram unremarkable.  Cardiology follow-up arranged.        Discharge Instructions  Discharge Instructions    Diet - low sodium heart healthy   Complete by: As  directed    Discharge instructions   Complete by: As directed    From Dr. Loleta Books: You were admitted for passing out.  Thankfully, here everything checked out except you were noticed to be in atrial fibrillation.  I suspect this contributed to the passing out.  Atrial fibrillation is important because the heart is at risk to cause strokes when it is in this abnormal rhythm.  As a result, it is important to take a blood thinner.  I recommend you take apixaban/Eliquis 5 mg twice daily from now on Go see your primary care doctor as soon as possible  Also, we have arranged for you to be seen by a Cardiologist (see details below), please attend this appointment   Increase activity slowly   Complete by: As directed      Allergies as of 03/30/2020   No Known Allergies     Medication List    STOP taking these medications   aspirin 81 MG tablet     TAKE these medications   amLODipine 10 MG tablet Commonly known as: NORVASC Take 10 mg by mouth at bedtime.   apixaban 5 MG Tabs tablet Commonly known as: ELIQUIS Take 1 tablet (5 mg total) by mouth 2 (two) times daily.   fish oil-omega-3 fatty acids 1000 MG capsule Take 1 g by mouth daily.   furosemide 20 MG tablet Commonly known as: LASIX Take 20 mg by mouth daily as needed for fluid or edema.   hydrochlorothiazide 25 MG tablet Commonly known as: HYDRODIURIL Take 25 mg  by mouth every morning.   levothyroxine 100 MCG tablet Commonly known as: SYNTHROID Take 100 mcg by mouth every morning.   lisinopril 20 MG tablet Commonly known as: ZESTRIL Take 10 mg by mouth every morning.   metoprolol succinate 25 MG 24 hr tablet Commonly known as: TOPROL-XL Take 25 mg by mouth every morning.   multivitamins ther. w/minerals Tabs tablet Take 1 tablet by mouth every morning.   vitamin C 1000 MG tablet Take 1,000 mg by mouth every morning.   Vitamin D3 125 MCG (5000 UT) Tabs Take 1 tablet by mouth daily.      Follow-up  Information    [facility] Follow up on 04/12/2020.   Specialty: Cardiology Why: Cardiology Hospital Follow-up on 04/12/2020 at 2:00 PM with Dr. Johnsie Cancel.  Contact information: [place] [phone]         No Known Allergies  Consultations:     Procedures/Studies: CT Head Wo Contrast  Result Date: 03/29/2020 CLINICAL DATA:  Syncope, headache, posttraumatic EXAM: CT HEAD WITHOUT CONTRAST TECHNIQUE: Contiguous axial images were obtained from the base of the skull through the vertex without intravenous contrast. COMPARISON:  MRI 10/06/2015 FINDINGS: Brain: Mild age related volume loss. Old right cerebellar infarct. No acute intracranial abnormality. Specifically, no hemorrhage, hydrocephalus, mass lesion, acute infarction, or significant intracranial injury. Vascular: No hyperdense vessel or unexpected calcification. Skull: No acute calvarial abnormality. Sinuses/Orbits: No acute findings. Mucosal thickening in the right sphenoid sinus and posterior right ethmoid air cells. Other: None IMPRESSION: No acute intracranial abnormality. Electronically Signed   By: Rolm Baptise M.D.   On: 03/29/2020 19:16   CT Cervical Spine Wo Contrast  Result Date: 03/29/2020 CLINICAL DATA:  Syncope, fall EXAM: CT CERVICAL SPINE WITHOUT CONTRAST TECHNIQUE: Multidetector CT imaging of the cervical spine was performed without intravenous contrast. Multiplanar CT image reconstructions were also generated. COMPARISON:  None. FINDINGS: Alignment: No subluxation. Skull base and vertebrae: No acute fracture. No primary bone lesion or focal pathologic process. Soft tissues and spinal canal: No prevertebral fluid or swelling. No visible canal hematoma. Disc levels:  Early degenerative disc and facet disease. Upper chest: No acute findings Other: None IMPRESSION: No acute bony abnormality. Electronically Signed   By: Rolm Baptise M.D.   On: 03/29/2020 19:17   DG Chest  Portable 1 View  Result Date: 03/29/2020 CLINICAL DATA:  Recent syncopal episodes EXAM: PORTABLE CHEST 1 VIEW COMPARISON:  10/10/2015 FINDINGS: Cardiac shadow is mildly prominent. Aortic calcifications are seen. The lungs are well aerated bilaterally. Mild patchy basilar atelectasis is seen. No effusion is noted. No acute bony abnormality is noted. IMPRESSION: Mild bibasilar atelectatic changes. Electronically Signed   By: Inez Catalina M.D.   On: 03/29/2020 22:01   DG Knee Complete 4 Views Right  Result Date: 03/29/2020 CLINICAL DATA:  Fall EXAM: RIGHT KNEE - COMPLETE 4+ VIEW COMPARISON:  None FINDINGS: Changes of right knee replacement. No joint effusion. No acute bony abnormality. Specifically, no fracture, subluxation, or dislocation. IMPRESSION: Right knee replacement.  No acute bony abnormality. Electronically Signed   By: Rolm Baptise M.D.   On: 03/29/2020 19:14   DG Foot Complete Left  Result Date: 03/29/2020 CLINICAL DATA:  Fall EXAM: LEFT FOOT - COMPLETE 3+ VIEW COMPARISON:  None. FINDINGS: Screw seen within the distal left talus. Advanced degenerative changes at the talonavicular joint. No acute fracture, subluxation or dislocation. Plantar calcaneal spur. Soft tissues are intact. IMPRESSION: No acute bony abnormality. Electronically Signed  By: Rolm Baptise M.D.   On: 03/29/2020 19:15       Subjective: Patient feeling well.  She has no chest pain, palpitations, leg swelling, orthopnea, confusion, fever.  Discharge Exam: Vitals:   03/30/20 0830 03/30/20 0900  BP: 119/68 125/86  Pulse: 65 71  Resp: 12 11  Temp:    SpO2: 99% 99%   Vitals:   03/30/20 0730 03/30/20 0800 03/30/20 0830 03/30/20 0900  BP: (!) 106/59 121/72 119/68 125/86  Pulse: 73 71 65 71  Resp: 15 15 12 11   Temp:      TempSrc:      SpO2: 100% 100% 99% 99%  Weight:      Height:        General: Pt is alert, awake, not in acute distress, elderly, lying in bed Cardiovascular: Normal rate, irregularly irregular  rhythm, nl S1-S2, no murmurs appreciated.   No LE edema.   Respiratory: Normal respiratory rate and rhythm.  CTAB without rales or wheezes. Abdominal: Abdomen soft and non-tender.  No distension or HSM.   Neuro/Psych: Strength symmetric in upper and lower extremities.  Judgment and insight appear normal.   The results of significant diagnostics from this hospitalization (including imaging, microbiology, ancillary and laboratory) are listed below for reference.     Microbiology: Recent Results (from the past 240 hour(s))  SARS Coronavirus 2 by RT PCR (hospital order, performed in [facility] hospital lab) Nasopharyngeal Nasopharyngeal Swab     Status: None   Collection Time: 03/29/20  9:21 PM   Specimen: Nasopharyngeal Swab  Result Value Ref Range Status   SARS Coronavirus 2 NEGATIVE NEGATIVE Final    Comment: (NOTE) SARS-CoV-2 target nucleic acids are NOT DETECTED. The SARS-CoV-2 RNA is generally detectable in upper and lower respiratory specimens during the acute phase of infection. The lowest concentration of SARS-CoV-2 viral copies this assay can detect is 250 copies / mL. A negative result does not preclude SARS-CoV-2 infection and should not be used as the sole basis for treatment or other patient management decisions.  A negative result may occur with improper specimen collection / handling, submission of specimen other than nasopharyngeal swab, presence of viral mutation(s) within the areas targeted by this assay, and inadequate number of viral copies (<250 copies / mL). A negative result must be combined with clinical observations, patient history, and epidemiological information. Fact Sheet for Patients:   StrictlyIdeas.no Fact Sheet for Healthcare Providers: BankingDealers.co.za This test is not yet approved or cleared  by the [place] FDA and has been authorized for detection and/or diagnosis of SARS-CoV-2 by FDA under  an Emergency Use Authorization (EUA).  This EUA will remain in effect (meaning this test can be used) for the duration of the COVID-19 declaration under Section 564(b)(1) of the Act, 21 U.S.C. section 360bbb-3(b)(1), unless the authorization is terminated or revoked sooner. Performed at [facility], [address], [place], [place] [address]      Labs: BNP (last 3 results) No results for input(s): BNP in the last 8760 hours. Basic Metabolic Panel: Recent Labs  Lab 03/29/20 1825 03/30/20 0401 03/30/20 0808  NA 132* 134*  --   K 3.5 3.8  --   CL 99 101  --   CO2 19* 23  --   GLUCOSE 120* 100*  --   BUN 27* 21  --   CREATININE 1.44* 1.13*  --   CALCIUM 9.0 9.1  --   MG  --   --  1.8  Liver Function Tests: No results for input(s): AST, ALT, ALKPHOS, BILITOT, PROT, ALBUMIN in the last 168 hours. No results for input(s): LIPASE, AMYLASE in the last 168 hours. No results for input(s): AMMONIA in the last 168 hours. CBC: Recent Labs  Lab 03/29/20 1825 03/30/20 0401  WBC 7.5 5.6  HGB 10.5* 10.0*  HCT 32.8* 30.2*  MCV 90.9 89.6  PLT 198 191   Cardiac Enzymes: No results for input(s): CKTOTAL, CKMB, CKMBINDEX, TROPONINI in the last 168 hours. BNP: Invalid input(s): POCBNP CBG: Recent Labs  Lab 03/29/20 1901  GLUCAP 125*   D-Dimer No results for input(s): DDIMER in the last 72 hours. Hgb A1c No results for input(s): HGBA1C in the last 72 hours. Lipid Profile No results for input(s): CHOL, HDL, LDLCALC, TRIG, CHOLHDL, LDLDIRECT in the last 72 hours. Thyroid function studies Recent Labs    03/30/20 0401  TSH 3.929   Anemia work up No results for input(s): VITAMINB12, FOLATE, FERRITIN, TIBC, IRON, RETICCTPCT in the last 72 hours. Urinalysis    Component Value Date/Time   COLORURINE YELLOW 03/29/2020 0932   APPEARANCEUR CLEAR 03/29/2020 0932   LABSPEC 1.009 03/29/2020 0932   PHURINE 6.0 03/29/2020 0932   GLUCOSEU NEGATIVE 03/29/2020 0932   HGBUR NEGATIVE  03/29/2020 0932   BILIRUBINUR NEGATIVE 03/29/2020 0932   KETONESUR NEGATIVE 03/29/2020 0932   PROTEINUR NEGATIVE 03/29/2020 0932   NITRITE NEGATIVE 03/29/2020 0932   LEUKOCYTESUR NEGATIVE 03/29/2020 0932   Sepsis Labs Invalid input(s): PROCALCITONIN,  WBC,  LACTICIDVEN Microbiology Recent Results (from the past 240 hour(s))  SARS Coronavirus 2 by RT PCR (hospital order, performed in [place] hospital lab) Nasopharyngeal Nasopharyngeal Swab     Status: None   Collection Time: 03/29/20  9:21 PM   Specimen: Nasopharyngeal Swab  Result Value Ref Range Status   SARS Coronavirus 2 NEGATIVE NEGATIVE Final    Comment: (NOTE) SARS-CoV-2 target nucleic acids are NOT DETECTED. The SARS-CoV-2 RNA is generally detectable in upper and lower respiratory specimens during the acute phase of infection. The lowest concentration of SARS-CoV-2 viral copies this assay can detect is 250 copies / mL. A negative result does not preclude SARS-CoV-2 infection and should not be used as the sole basis for treatment or other patient management decisions.  A negative result may occur with improper specimen collection / handling, submission of specimen other than nasopharyngeal swab, presence of viral mutation(s) within the areas targeted by this assay, and inadequate number of viral copies (<250 copies / mL). A negative result must be combined with clinical observations, patient history, and epidemiological information. Fact Sheet for Patients:   StrictlyIdeas.no Fact Sheet for Healthcare Providers: BankingDealers.co.za This test is not yet approved or cleared  by the [place] FDA and has been authorized for detection and/or diagnosis of SARS-CoV-2 by FDA under an Emergency Use Authorization (EUA).  This EUA will remain in effect (meaning this test can be used) for the duration of the COVID-19 declaration under Section 564(b)(1) of the Act, 21  U.S.C. section 360bbb-3(b)(1), unless the authorization is terminated or revoked sooner. Performed at [facility], [address], [place], [place] [address]      Time coordinating discharge: 25 minutes      SIGNED:   Edwin Dada, MD  [facility] 03/30/2020, 10:24 AM

## 2020-03-30 NOTE — ED Notes (Signed)
PT here to evaluate patient

## 2020-03-30 NOTE — Discharge Instructions (Signed)

## 2020-03-30 NOTE — Progress Notes (Signed)
CSW in contact with patient to discuss home health/PT recommendation. Patient was receptive but states that she attends a senior citizen day program and would have to coordinate the time and schedule with the [facility] provider. Family does not have a preference for home health provider.   CSW will coordinate home health needs with [facility] team.   [facility] [facility]  Clinical Social Worker  Ph: [phone]

## 2020-03-30 NOTE — ED Notes (Signed)
Have discharged patient, per Dr. Sabino Niemann permission.

## 2020-03-30 NOTE — Progress Notes (Signed)
CSW in contact with Loretta Hunt from [facility] to provide her with HH/PT referral.   [place] [facility]  Clinical Social Worker  Ph: [phone]

## 2020-03-30 NOTE — ED Notes (Signed)
Patient ambulatory to the restroom with assistance

## 2020-04-03 DIAGNOSIS — I4891 Unspecified atrial fibrillation: Secondary | ICD-10-CM | POA: Diagnosis not present

## 2020-04-05 DIAGNOSIS — Z6832 Body mass index (BMI) 32.0-32.9, adult: Secondary | ICD-10-CM | POA: Diagnosis not present

## 2020-04-05 DIAGNOSIS — R55 Syncope and collapse: Secondary | ICD-10-CM | POA: Diagnosis not present

## 2020-04-05 DIAGNOSIS — I10 Essential (primary) hypertension: Secondary | ICD-10-CM | POA: Diagnosis not present

## 2020-04-05 DIAGNOSIS — E6609 Other obesity due to excess calories: Secondary | ICD-10-CM | POA: Diagnosis not present

## 2020-04-12 ENCOUNTER — Ambulatory Visit: Payer: Medicare Other | Admitting: Cardiovascular Disease

## 2020-04-26 DIAGNOSIS — E039 Hypothyroidism, unspecified: Secondary | ICD-10-CM | POA: Diagnosis not present

## 2020-04-26 DIAGNOSIS — M069 Rheumatoid arthritis, unspecified: Secondary | ICD-10-CM | POA: Diagnosis not present

## 2020-04-26 DIAGNOSIS — M1991 Primary osteoarthritis, unspecified site: Secondary | ICD-10-CM | POA: Diagnosis not present

## 2020-04-26 DIAGNOSIS — D649 Anemia, unspecified: Secondary | ICD-10-CM | POA: Diagnosis not present

## 2020-05-26 DIAGNOSIS — D649 Anemia, unspecified: Secondary | ICD-10-CM | POA: Diagnosis not present

## 2020-05-26 DIAGNOSIS — M1991 Primary osteoarthritis, unspecified site: Secondary | ICD-10-CM | POA: Diagnosis not present

## 2020-05-26 DIAGNOSIS — E039 Hypothyroidism, unspecified: Secondary | ICD-10-CM | POA: Diagnosis not present

## 2020-05-26 DIAGNOSIS — M069 Rheumatoid arthritis, unspecified: Secondary | ICD-10-CM | POA: Diagnosis not present

## 2020-06-27 DIAGNOSIS — I10 Essential (primary) hypertension: Secondary | ICD-10-CM | POA: Diagnosis not present

## 2020-06-27 DIAGNOSIS — E039 Hypothyroidism, unspecified: Secondary | ICD-10-CM | POA: Diagnosis not present

## 2020-06-27 DIAGNOSIS — E7849 Other hyperlipidemia: Secondary | ICD-10-CM | POA: Diagnosis not present

## 2020-07-03 DIAGNOSIS — R001 Bradycardia, unspecified: Secondary | ICD-10-CM | POA: Diagnosis not present

## 2020-07-03 DIAGNOSIS — R4020 Unspecified coma: Secondary | ICD-10-CM | POA: Diagnosis not present

## 2020-07-03 DIAGNOSIS — R55 Syncope and collapse: Secondary | ICD-10-CM | POA: Diagnosis not present

## 2020-07-03 DIAGNOSIS — R0689 Other abnormalities of breathing: Secondary | ICD-10-CM | POA: Diagnosis not present

## 2020-07-03 DIAGNOSIS — W19XXXA Unspecified fall, initial encounter: Secondary | ICD-10-CM | POA: Diagnosis not present

## 2020-07-27 DIAGNOSIS — E039 Hypothyroidism, unspecified: Secondary | ICD-10-CM | POA: Diagnosis not present

## 2020-07-27 DIAGNOSIS — I10 Essential (primary) hypertension: Secondary | ICD-10-CM | POA: Diagnosis not present

## 2020-08-02 DIAGNOSIS — Z23 Encounter for immunization: Secondary | ICD-10-CM | POA: Diagnosis not present

## 2020-08-26 DIAGNOSIS — I10 Essential (primary) hypertension: Secondary | ICD-10-CM | POA: Diagnosis not present

## 2020-08-26 DIAGNOSIS — E039 Hypothyroidism, unspecified: Secondary | ICD-10-CM | POA: Diagnosis not present

## 2020-08-28 DIAGNOSIS — H16223 Keratoconjunctivitis sicca, not specified as Sjogren's, bilateral: Secondary | ICD-10-CM | POA: Diagnosis not present

## 2020-09-26 DIAGNOSIS — I10 Essential (primary) hypertension: Secondary | ICD-10-CM | POA: Diagnosis not present

## 2020-09-26 DIAGNOSIS — E039 Hypothyroidism, unspecified: Secondary | ICD-10-CM | POA: Diagnosis not present

## 2020-09-27 DIAGNOSIS — G4700 Insomnia, unspecified: Secondary | ICD-10-CM | POA: Diagnosis not present

## 2020-09-27 DIAGNOSIS — I8391 Asymptomatic varicose veins of right lower extremity: Secondary | ICD-10-CM | POA: Diagnosis not present

## 2020-09-27 DIAGNOSIS — E6609 Other obesity due to excess calories: Secondary | ICD-10-CM | POA: Diagnosis not present

## 2020-09-27 DIAGNOSIS — Z6830 Body mass index (BMI) 30.0-30.9, adult: Secondary | ICD-10-CM | POA: Diagnosis not present

## 2020-09-30 IMAGING — DX DG CHEST 1V PORT
1 series · 1 of 1 positions shown · non-contrast
Comparison: 10/10/2015

CLINICAL DATA: Recent syncopal episodes

EXAM:
PORTABLE CHEST 1 VIEW

[chest ap]
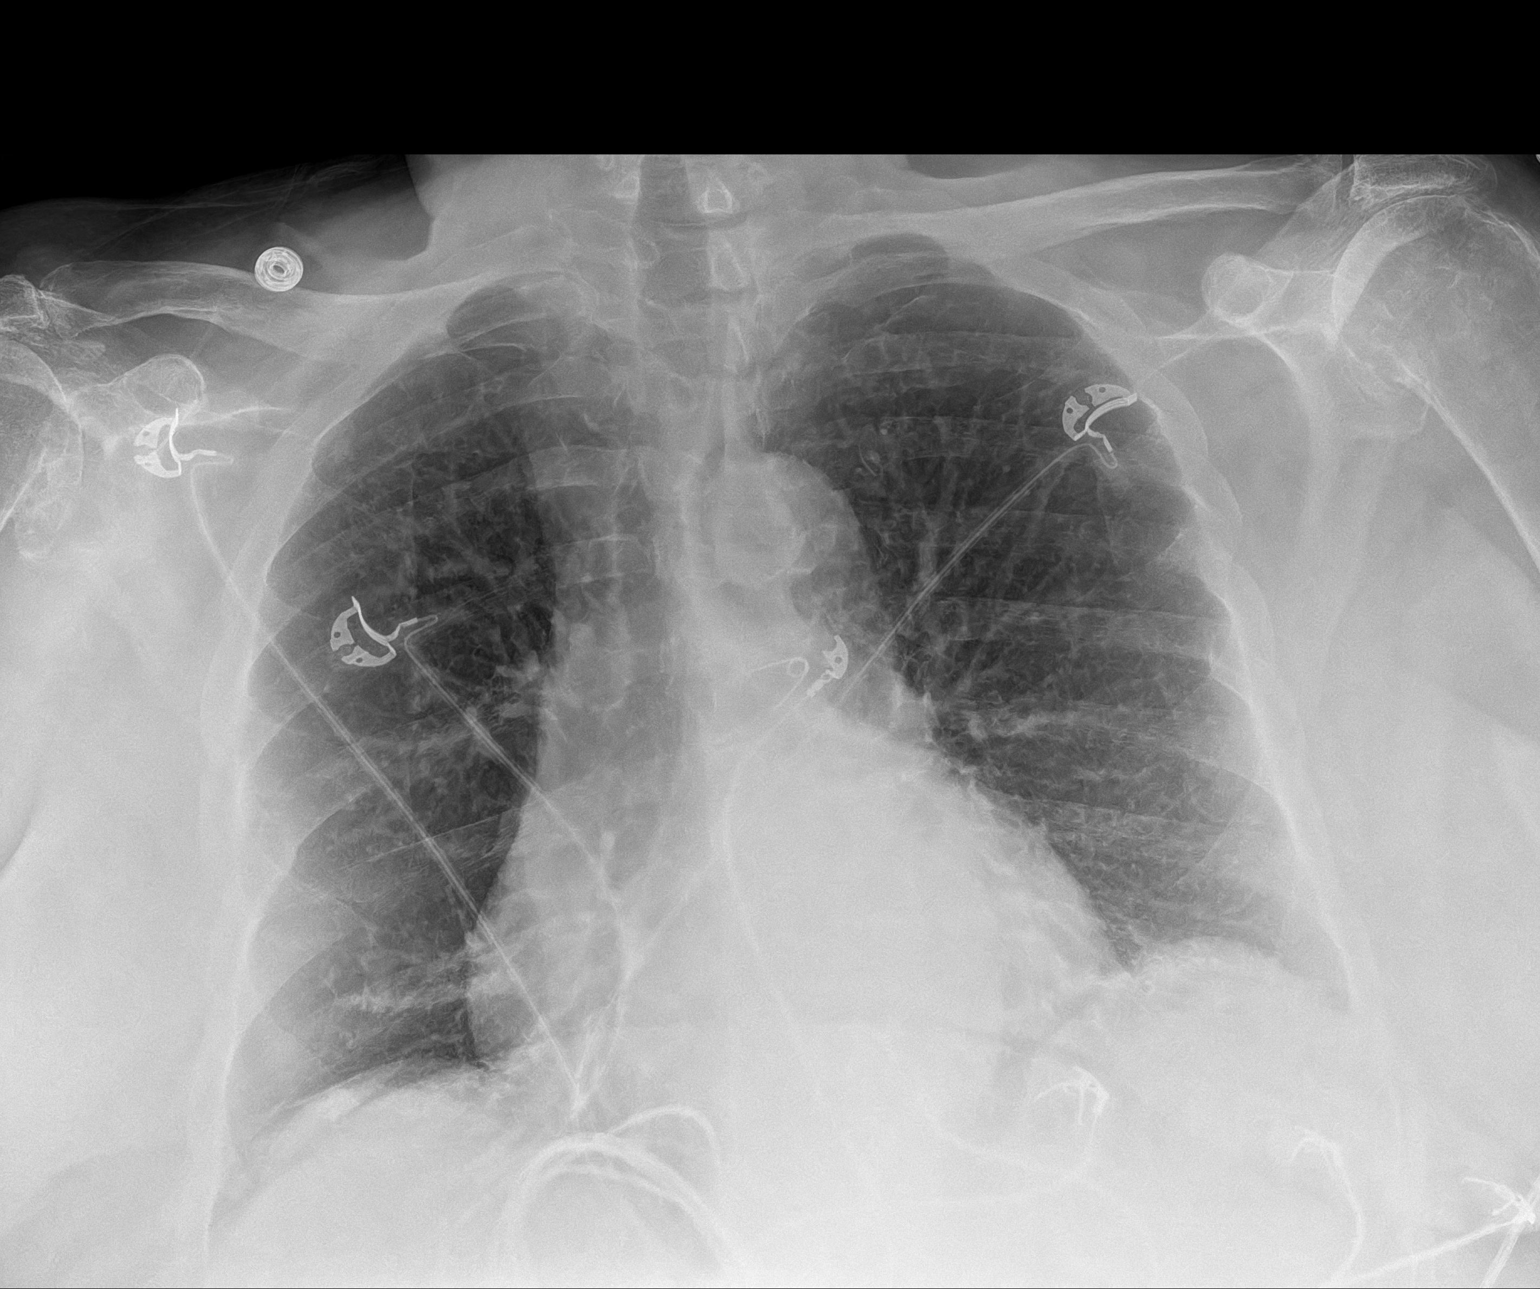

[1 of 1 positions shown; findings below may reference images not displayed]

FINDINGS: Cardiac shadow is mildly prominent. Aortic calcifications are seen.
The lungs are well aerated bilaterally. Mild patchy basilar
atelectasis is seen. No effusion is noted. No acute bony abnormality
is noted.
IMPRESSION: Mild bibasilar atelectatic changes.

## 2020-09-30 IMAGING — DX DG KNEE COMPLETE 4+V*R*
1 series · 1 of 1 positions shown · non-contrast
Comparison: None

CLINICAL DATA: Fall

EXAM:
RIGHT KNEE - COMPLETE 4+ VIEW

[knee lat]
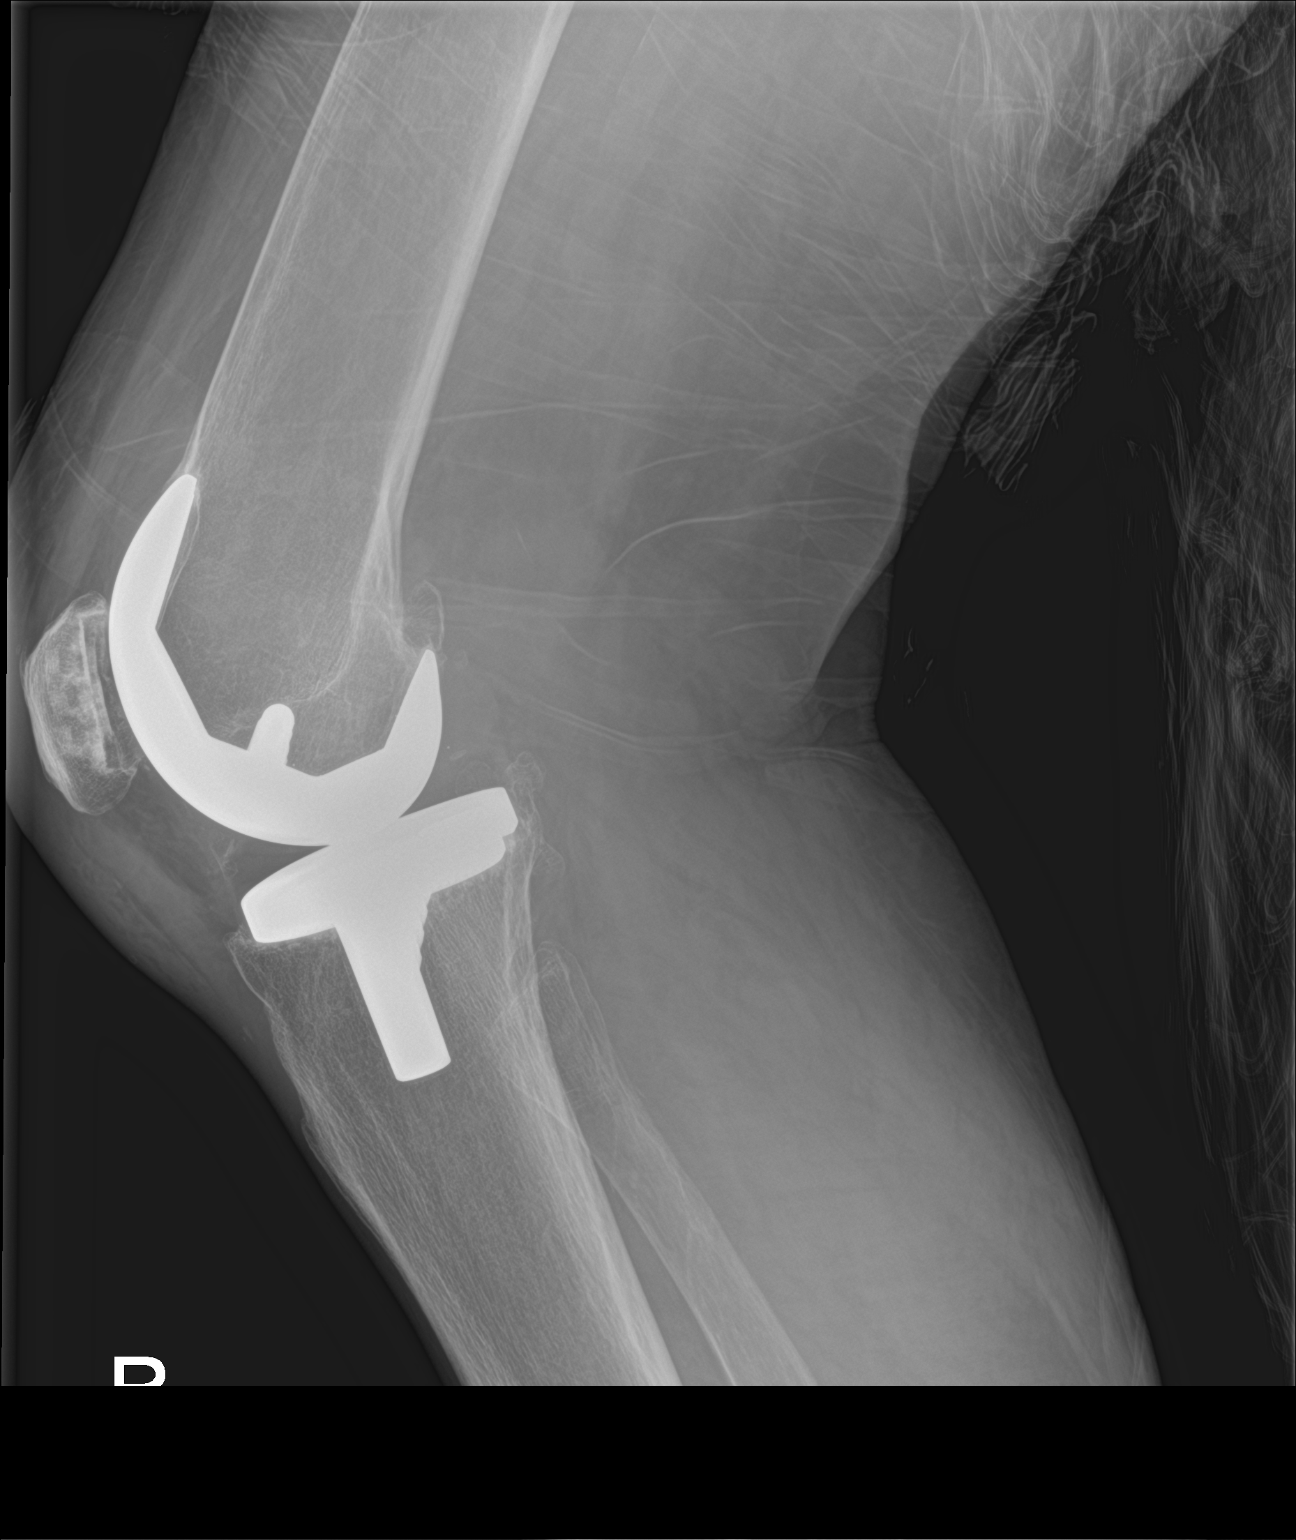

[1 of 1 positions shown; findings below may reference images not displayed]

FINDINGS: Changes of right knee replacement. No joint effusion. No acute bony
abnormality. Specifically, no fracture, subluxation, or dislocation.
IMPRESSION: Right knee replacement.  No acute bony abnormality.

## 2020-09-30 IMAGING — CT CT HEAD W/O CM
3 series · 16 of 47 positions shown, 19 images · non-contrast
Comparison: MRI 10/06/2015

CLINICAL DATA: Syncope, headache, posttraumatic

EXAM:
CT HEAD WITHOUT CONTRAST
TECHNIQUE: Contiguous axial images were obtained from the base of the skull
through the vertex without intravenous contrast.

[Series 2: head w o · axial · 0.49mm/px · z∈[+1308,+1433]mm · 10 of 31 slices shown, 13 images]
[im 3/31  brain]
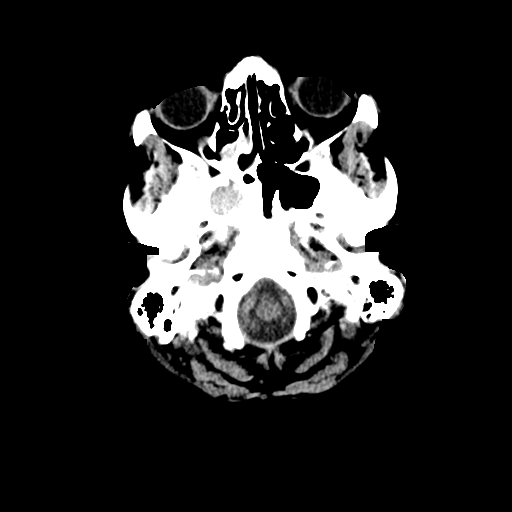
[im 3/31  bone]
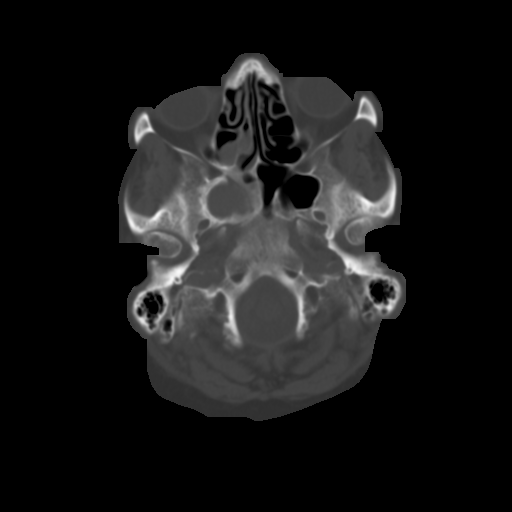
[im 6/31  brain]
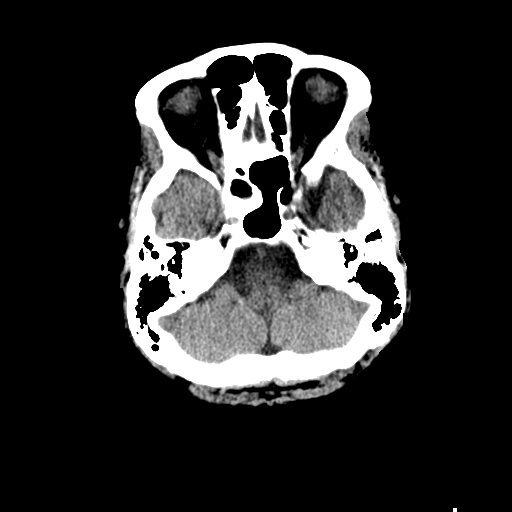
[im 9/31  brain]
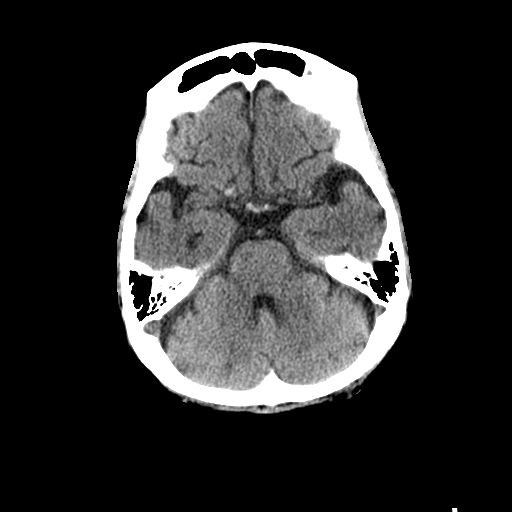
[im 11/31  brain]
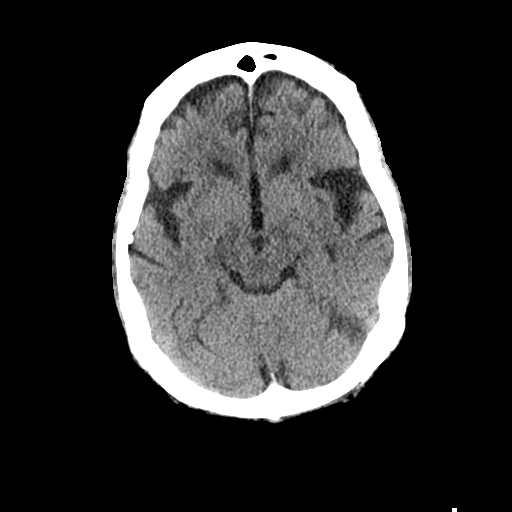
[im 14/31  brain]
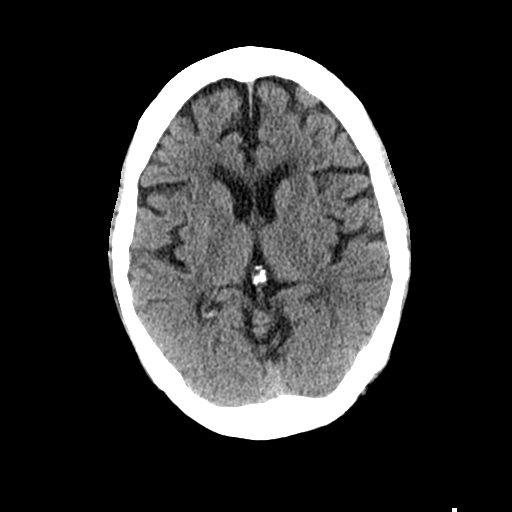
[im 14/31  bone]
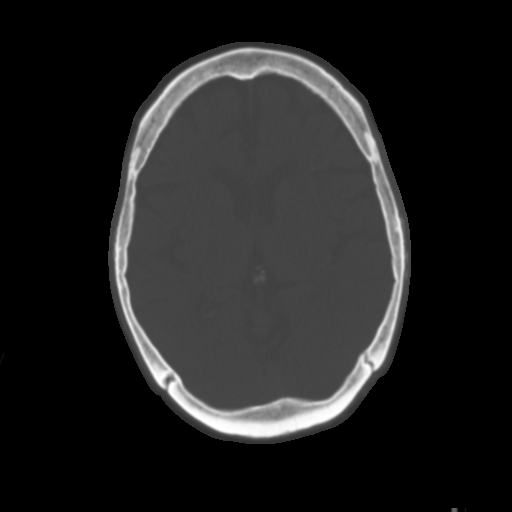
[im 17/31  brain]
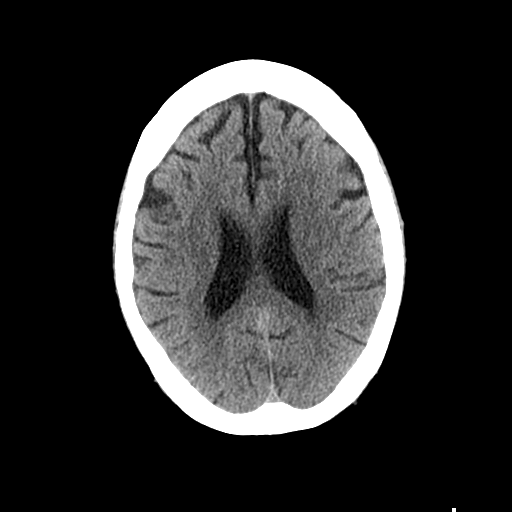
[im 20/31  brain]
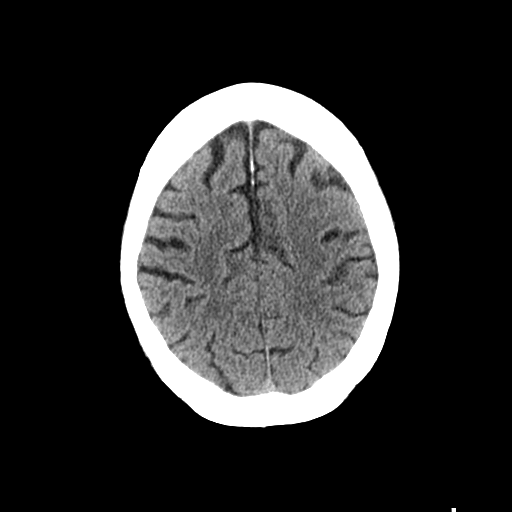
[im 23/31  brain]
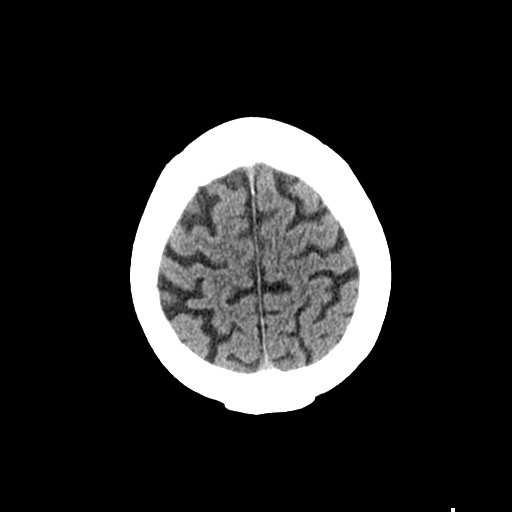
[im 25/31  brain]
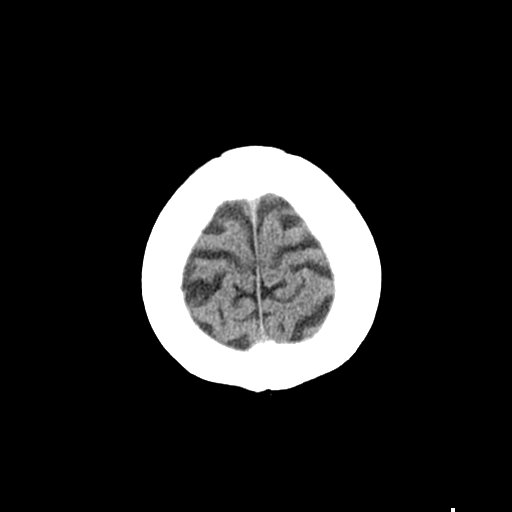
[im 25/31  bone]
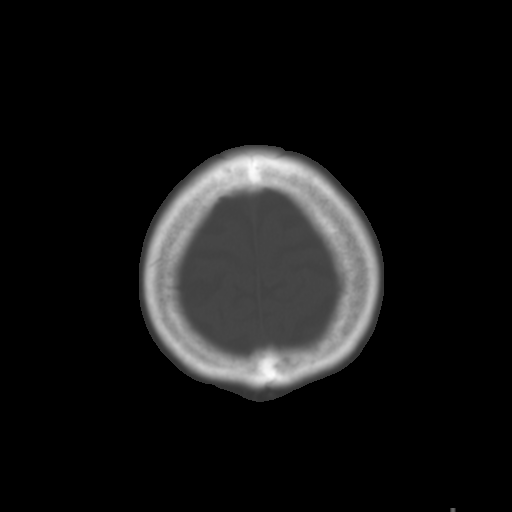
[im 28/31  brain]
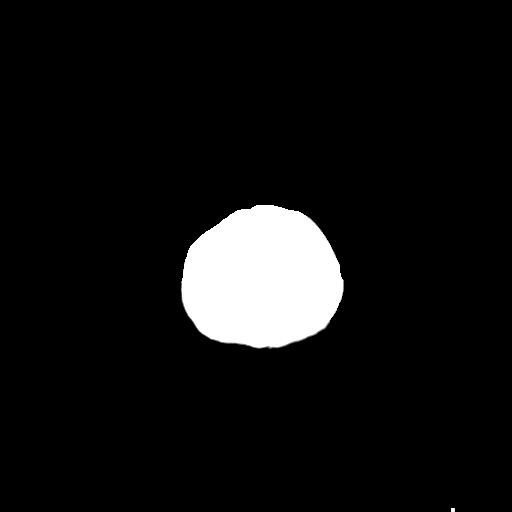

[Series 4: coronal soft · coronal · 0.36mm/px · 3 of 77 slices shown]
[im 26/77  brain]
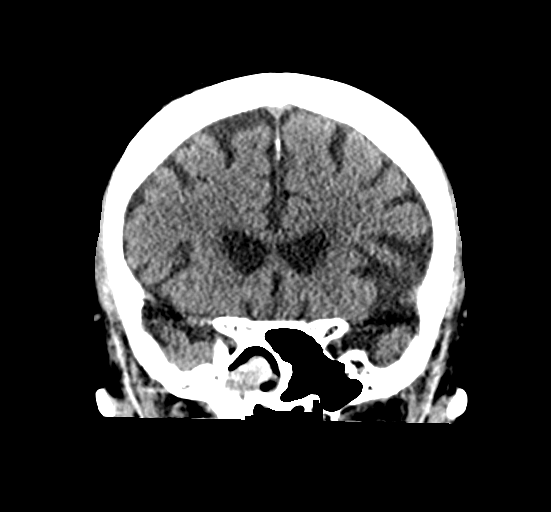
[im 34/77  brain]
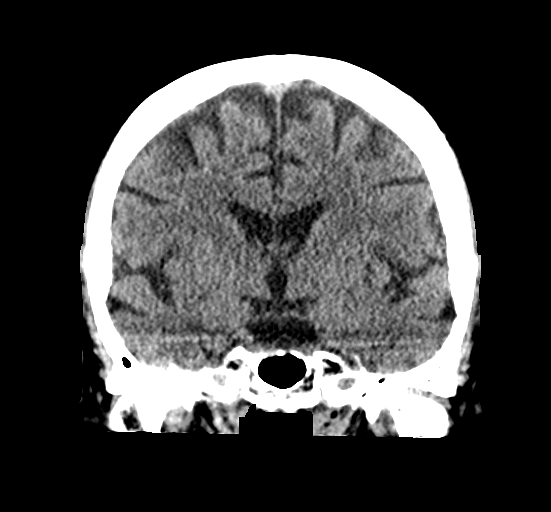
[im 43/77  brain]
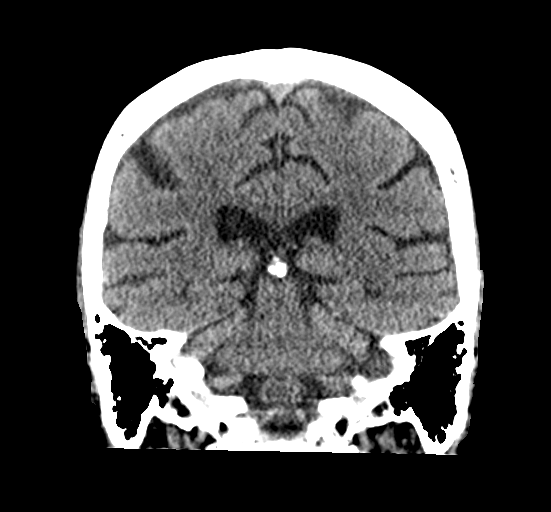

[Series 5: sagittal soft · sagittal · 0.36mm/px · 3 of 58 slices shown]
[im 20/58  brain]
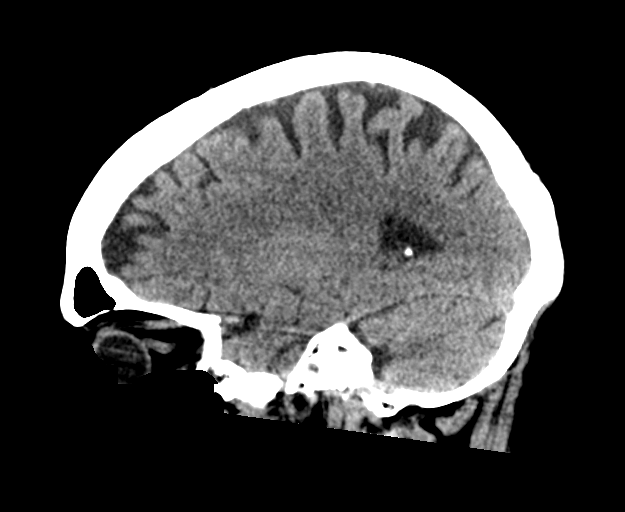
[im 29/58  brain]
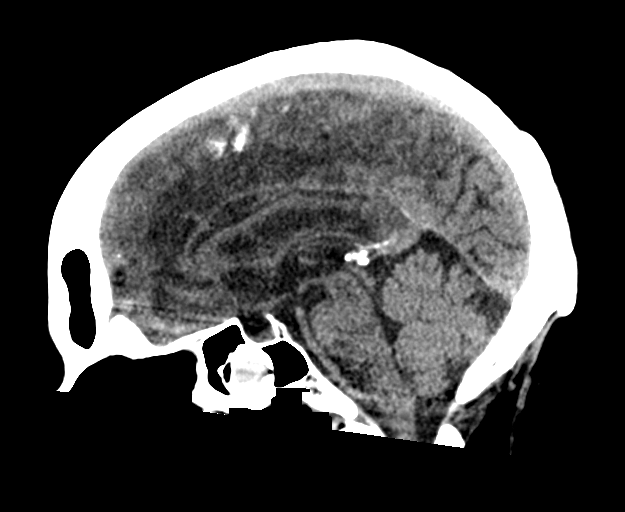
[im 39/58  brain]
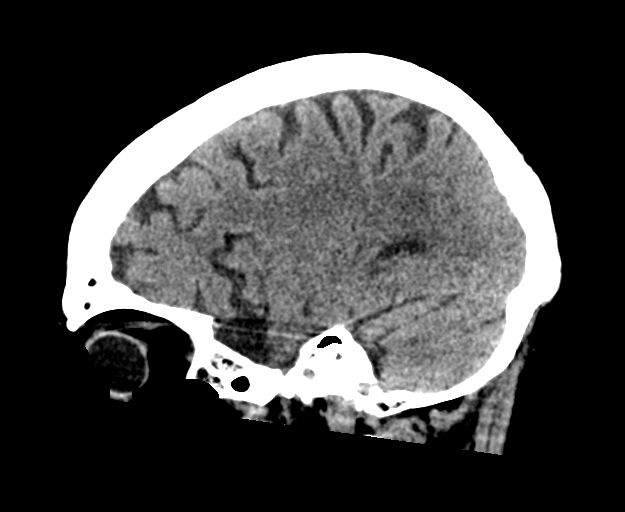

[16 of 47 positions shown; findings below may reference images not displayed]

FINDINGS: Brain: Mild age related volume loss. Old right cerebellar infarct.
No acute intracranial abnormality. Specifically, no hemorrhage,
hydrocephalus, mass lesion, acute infarction, or significant
intracranial injury.

Vascular: No hyperdense vessel or unexpected calcification.

Skull: No acute calvarial abnormality.

Sinuses/Orbits: No acute findings. Mucosal thickening in the right
sphenoid sinus and posterior right ethmoid air cells.

Other: None
IMPRESSION: No acute intracranial abnormality.

## 2020-10-27 DIAGNOSIS — E039 Hypothyroidism, unspecified: Secondary | ICD-10-CM | POA: Diagnosis not present

## 2020-10-27 DIAGNOSIS — I10 Essential (primary) hypertension: Secondary | ICD-10-CM | POA: Diagnosis not present

## 2020-11-25 DIAGNOSIS — E039 Hypothyroidism, unspecified: Secondary | ICD-10-CM | POA: Diagnosis not present

## 2020-11-25 DIAGNOSIS — I10 Essential (primary) hypertension: Secondary | ICD-10-CM | POA: Diagnosis not present

## 2020-12-07 DIAGNOSIS — Z961 Presence of intraocular lens: Secondary | ICD-10-CM | POA: Diagnosis not present

## 2020-12-07 DIAGNOSIS — H26493 Other secondary cataract, bilateral: Secondary | ICD-10-CM | POA: Diagnosis not present

## 2020-12-07 DIAGNOSIS — H18593 Other hereditary corneal dystrophies, bilateral: Secondary | ICD-10-CM | POA: Diagnosis not present

## 2020-12-25 DIAGNOSIS — E039 Hypothyroidism, unspecified: Secondary | ICD-10-CM | POA: Diagnosis not present

## 2020-12-25 DIAGNOSIS — I10 Essential (primary) hypertension: Secondary | ICD-10-CM | POA: Diagnosis not present

## 2021-01-04 DIAGNOSIS — H26491 Other secondary cataract, right eye: Secondary | ICD-10-CM | POA: Diagnosis not present

## 2021-01-04 DIAGNOSIS — H02834 Dermatochalasis of left upper eyelid: Secondary | ICD-10-CM | POA: Diagnosis not present

## 2021-01-04 DIAGNOSIS — H02831 Dermatochalasis of right upper eyelid: Secondary | ICD-10-CM | POA: Diagnosis not present

## 2021-01-15 DIAGNOSIS — E663 Overweight: Secondary | ICD-10-CM | POA: Diagnosis not present

## 2021-01-15 DIAGNOSIS — M79604 Pain in right leg: Secondary | ICD-10-CM | POA: Diagnosis not present

## 2021-01-15 DIAGNOSIS — R009 Unspecified abnormalities of heart beat: Secondary | ICD-10-CM | POA: Diagnosis not present

## 2021-01-15 DIAGNOSIS — Z6829 Body mass index (BMI) 29.0-29.9, adult: Secondary | ICD-10-CM | POA: Diagnosis not present

## 2021-01-24 DIAGNOSIS — E039 Hypothyroidism, unspecified: Secondary | ICD-10-CM | POA: Diagnosis not present

## 2021-01-24 DIAGNOSIS — I129 Hypertensive chronic kidney disease with stage 1 through stage 4 chronic kidney disease, or unspecified chronic kidney disease: Secondary | ICD-10-CM | POA: Diagnosis not present

## 2021-01-24 DIAGNOSIS — I10 Essential (primary) hypertension: Secondary | ICD-10-CM | POA: Diagnosis not present

## 2021-02-20 DIAGNOSIS — M1990 Unspecified osteoarthritis, unspecified site: Secondary | ICD-10-CM | POA: Diagnosis not present

## 2021-02-20 DIAGNOSIS — E7849 Other hyperlipidemia: Secondary | ICD-10-CM | POA: Diagnosis not present

## 2021-02-20 DIAGNOSIS — Z6830 Body mass index (BMI) 30.0-30.9, adult: Secondary | ICD-10-CM | POA: Diagnosis not present

## 2021-02-20 DIAGNOSIS — Z0000 Encounter for general adult medical examination without abnormal findings: Secondary | ICD-10-CM | POA: Diagnosis not present

## 2021-02-20 DIAGNOSIS — Z1331 Encounter for screening for depression: Secondary | ICD-10-CM | POA: Diagnosis not present

## 2021-02-20 DIAGNOSIS — Z1389 Encounter for screening for other disorder: Secondary | ICD-10-CM | POA: Diagnosis not present

## 2021-02-20 DIAGNOSIS — E6609 Other obesity due to excess calories: Secondary | ICD-10-CM | POA: Diagnosis not present

## 2021-02-20 DIAGNOSIS — Z0001 Encounter for general adult medical examination with abnormal findings: Secondary | ICD-10-CM | POA: Diagnosis not present

## 2021-02-20 DIAGNOSIS — E782 Mixed hyperlipidemia: Secondary | ICD-10-CM | POA: Diagnosis not present

## 2021-02-24 DIAGNOSIS — E039 Hypothyroidism, unspecified: Secondary | ICD-10-CM | POA: Diagnosis not present

## 2021-02-24 DIAGNOSIS — I10 Essential (primary) hypertension: Secondary | ICD-10-CM | POA: Diagnosis not present

## 2021-03-27 DIAGNOSIS — E039 Hypothyroidism, unspecified: Secondary | ICD-10-CM | POA: Diagnosis not present

## 2021-03-27 DIAGNOSIS — I10 Essential (primary) hypertension: Secondary | ICD-10-CM | POA: Diagnosis not present

## 2021-04-05 DIAGNOSIS — H02831 Dermatochalasis of right upper eyelid: Secondary | ICD-10-CM | POA: Diagnosis not present

## 2021-04-05 DIAGNOSIS — H02834 Dermatochalasis of left upper eyelid: Secondary | ICD-10-CM | POA: Diagnosis not present

## 2021-04-05 DIAGNOSIS — H18593 Other hereditary corneal dystrophies, bilateral: Secondary | ICD-10-CM | POA: Diagnosis not present

## 2021-04-05 DIAGNOSIS — H26491 Other secondary cataract, right eye: Secondary | ICD-10-CM | POA: Diagnosis not present

## 2021-04-26 DIAGNOSIS — E039 Hypothyroidism, unspecified: Secondary | ICD-10-CM | POA: Diagnosis not present

## 2021-04-26 DIAGNOSIS — I10 Essential (primary) hypertension: Secondary | ICD-10-CM | POA: Diagnosis not present

## 2021-05-14 DIAGNOSIS — H18593 Other hereditary corneal dystrophies, bilateral: Secondary | ICD-10-CM | POA: Diagnosis not present

## 2021-05-14 DIAGNOSIS — H02834 Dermatochalasis of left upper eyelid: Secondary | ICD-10-CM | POA: Diagnosis not present

## 2021-05-14 DIAGNOSIS — H26491 Other secondary cataract, right eye: Secondary | ICD-10-CM | POA: Diagnosis not present

## 2021-05-14 DIAGNOSIS — H02831 Dermatochalasis of right upper eyelid: Secondary | ICD-10-CM | POA: Diagnosis not present

## 2021-05-27 DIAGNOSIS — E039 Hypothyroidism, unspecified: Secondary | ICD-10-CM | POA: Diagnosis not present

## 2021-05-27 DIAGNOSIS — I10 Essential (primary) hypertension: Secondary | ICD-10-CM | POA: Diagnosis not present

## 2021-06-27 DIAGNOSIS — E039 Hypothyroidism, unspecified: Secondary | ICD-10-CM | POA: Diagnosis not present

## 2021-06-27 DIAGNOSIS — I10 Essential (primary) hypertension: Secondary | ICD-10-CM | POA: Diagnosis not present

## 2021-07-10 DIAGNOSIS — Z23 Encounter for immunization: Secondary | ICD-10-CM | POA: Diagnosis not present

## 2021-07-24 DIAGNOSIS — Z20822 Contact with and (suspected) exposure to covid-19: Secondary | ICD-10-CM | POA: Diagnosis not present

## 2021-07-27 DIAGNOSIS — I10 Essential (primary) hypertension: Secondary | ICD-10-CM | POA: Diagnosis not present

## 2021-07-27 DIAGNOSIS — E039 Hypothyroidism, unspecified: Secondary | ICD-10-CM | POA: Diagnosis not present

## 2021-08-23 DIAGNOSIS — E663 Overweight: Secondary | ICD-10-CM | POA: Diagnosis not present

## 2021-08-23 DIAGNOSIS — M19072 Primary osteoarthritis, left ankle and foot: Secondary | ICD-10-CM | POA: Diagnosis not present

## 2021-08-23 DIAGNOSIS — R42 Dizziness and giddiness: Secondary | ICD-10-CM | POA: Diagnosis not present

## 2021-08-23 DIAGNOSIS — Z6828 Body mass index (BMI) 28.0-28.9, adult: Secondary | ICD-10-CM | POA: Diagnosis not present

## 2021-08-23 DIAGNOSIS — G8929 Other chronic pain: Secondary | ICD-10-CM | POA: Diagnosis not present

## 2021-08-27 DIAGNOSIS — E039 Hypothyroidism, unspecified: Secondary | ICD-10-CM | POA: Diagnosis not present

## 2021-08-27 DIAGNOSIS — I10 Essential (primary) hypertension: Secondary | ICD-10-CM | POA: Diagnosis not present

## 2021-09-26 DIAGNOSIS — I10 Essential (primary) hypertension: Secondary | ICD-10-CM | POA: Diagnosis not present

## 2021-09-26 DIAGNOSIS — E039 Hypothyroidism, unspecified: Secondary | ICD-10-CM | POA: Diagnosis not present

## 2021-10-02 DIAGNOSIS — Z20822 Contact with and (suspected) exposure to covid-19: Secondary | ICD-10-CM | POA: Diagnosis not present

## 2021-10-26 DIAGNOSIS — I10 Essential (primary) hypertension: Secondary | ICD-10-CM | POA: Diagnosis not present

## 2021-10-26 DIAGNOSIS — E039 Hypothyroidism, unspecified: Secondary | ICD-10-CM | POA: Diagnosis not present

## 2021-10-29 DIAGNOSIS — Z20822 Contact with and (suspected) exposure to covid-19: Secondary | ICD-10-CM | POA: Diagnosis not present

## 2021-11-19 DIAGNOSIS — Z20822 Contact with and (suspected) exposure to covid-19: Secondary | ICD-10-CM | POA: Diagnosis not present

## 2021-11-27 DIAGNOSIS — E039 Hypothyroidism, unspecified: Secondary | ICD-10-CM | POA: Diagnosis not present

## 2021-11-27 DIAGNOSIS — I10 Essential (primary) hypertension: Secondary | ICD-10-CM | POA: Diagnosis not present

## 2021-12-03 DIAGNOSIS — H04123 Dry eye syndrome of bilateral lacrimal glands: Secondary | ICD-10-CM | POA: Diagnosis not present

## 2021-12-17 DIAGNOSIS — Z20822 Contact with and (suspected) exposure to covid-19: Secondary | ICD-10-CM | POA: Diagnosis not present

## 2021-12-20 DIAGNOSIS — Z20822 Contact with and (suspected) exposure to covid-19: Secondary | ICD-10-CM | POA: Diagnosis not present

## 2021-12-24 DIAGNOSIS — Z20822 Contact with and (suspected) exposure to covid-19: Secondary | ICD-10-CM | POA: Diagnosis not present

## 2021-12-25 DIAGNOSIS — I10 Essential (primary) hypertension: Secondary | ICD-10-CM | POA: Diagnosis not present

## 2021-12-25 DIAGNOSIS — E039 Hypothyroidism, unspecified: Secondary | ICD-10-CM | POA: Diagnosis not present

## 2021-12-26 DIAGNOSIS — Z20822 Contact with and (suspected) exposure to covid-19: Secondary | ICD-10-CM | POA: Diagnosis not present

## 2021-12-28 DIAGNOSIS — Z20822 Contact with and (suspected) exposure to covid-19: Secondary | ICD-10-CM | POA: Diagnosis not present

## 2022-01-01 DIAGNOSIS — R42 Dizziness and giddiness: Secondary | ICD-10-CM | POA: Diagnosis not present

## 2022-01-01 DIAGNOSIS — I10 Essential (primary) hypertension: Secondary | ICD-10-CM | POA: Diagnosis not present

## 2022-01-01 DIAGNOSIS — Z6828 Body mass index (BMI) 28.0-28.9, adult: Secondary | ICD-10-CM | POA: Diagnosis not present

## 2022-01-01 DIAGNOSIS — E663 Overweight: Secondary | ICD-10-CM | POA: Diagnosis not present

## 2022-01-09 DIAGNOSIS — Z20828 Contact with and (suspected) exposure to other viral communicable diseases: Secondary | ICD-10-CM | POA: Diagnosis not present

## 2022-01-21 DIAGNOSIS — Z20822 Contact with and (suspected) exposure to covid-19: Secondary | ICD-10-CM | POA: Diagnosis not present

## 2022-01-23 DIAGNOSIS — Z20828 Contact with and (suspected) exposure to other viral communicable diseases: Secondary | ICD-10-CM | POA: Diagnosis not present

## 2022-01-25 DIAGNOSIS — I10 Essential (primary) hypertension: Secondary | ICD-10-CM | POA: Diagnosis not present

## 2022-01-25 DIAGNOSIS — E039 Hypothyroidism, unspecified: Secondary | ICD-10-CM | POA: Diagnosis not present

## 2022-01-26 DIAGNOSIS — Z20828 Contact with and (suspected) exposure to other viral communicable diseases: Secondary | ICD-10-CM | POA: Diagnosis not present

## 2022-02-11 DIAGNOSIS — Z20822 Contact with and (suspected) exposure to covid-19: Secondary | ICD-10-CM | POA: Diagnosis not present

## 2022-02-21 DIAGNOSIS — U071 COVID-19: Secondary | ICD-10-CM | POA: Diagnosis not present

## 2022-02-23 DIAGNOSIS — Z20828 Contact with and (suspected) exposure to other viral communicable diseases: Secondary | ICD-10-CM | POA: Diagnosis not present

## 2022-02-24 DIAGNOSIS — E039 Hypothyroidism, unspecified: Secondary | ICD-10-CM | POA: Diagnosis not present

## 2022-02-24 DIAGNOSIS — I10 Essential (primary) hypertension: Secondary | ICD-10-CM | POA: Diagnosis not present

## 2022-02-25 DIAGNOSIS — Z20828 Contact with and (suspected) exposure to other viral communicable diseases: Secondary | ICD-10-CM | POA: Diagnosis not present

## 2022-02-28 DIAGNOSIS — Z20822 Contact with and (suspected) exposure to covid-19: Secondary | ICD-10-CM | POA: Diagnosis not present

## 2022-03-04 DIAGNOSIS — Z20822 Contact with and (suspected) exposure to covid-19: Secondary | ICD-10-CM | POA: Diagnosis not present

## 2022-03-27 DIAGNOSIS — E039 Hypothyroidism, unspecified: Secondary | ICD-10-CM | POA: Diagnosis not present

## 2022-03-27 DIAGNOSIS — I10 Essential (primary) hypertension: Secondary | ICD-10-CM | POA: Diagnosis not present

## 2022-04-18 DIAGNOSIS — E039 Hypothyroidism, unspecified: Secondary | ICD-10-CM | POA: Diagnosis not present

## 2022-04-18 DIAGNOSIS — Z1331 Encounter for screening for depression: Secondary | ICD-10-CM | POA: Diagnosis not present

## 2022-04-18 DIAGNOSIS — M19072 Primary osteoarthritis, left ankle and foot: Secondary | ICD-10-CM | POA: Diagnosis not present

## 2022-04-18 DIAGNOSIS — Z6827 Body mass index (BMI) 27.0-27.9, adult: Secondary | ICD-10-CM | POA: Diagnosis not present

## 2022-04-18 DIAGNOSIS — E663 Overweight: Secondary | ICD-10-CM | POA: Diagnosis not present

## 2022-04-18 DIAGNOSIS — G4700 Insomnia, unspecified: Secondary | ICD-10-CM | POA: Diagnosis not present

## 2022-04-18 DIAGNOSIS — G8929 Other chronic pain: Secondary | ICD-10-CM | POA: Diagnosis not present

## 2022-04-18 DIAGNOSIS — E782 Mixed hyperlipidemia: Secondary | ICD-10-CM | POA: Diagnosis not present

## 2022-04-18 DIAGNOSIS — I10 Essential (primary) hypertension: Secondary | ICD-10-CM | POA: Diagnosis not present

## 2022-04-18 DIAGNOSIS — Z0001 Encounter for general adult medical examination with abnormal findings: Secondary | ICD-10-CM | POA: Diagnosis not present

## 2022-04-26 DIAGNOSIS — E039 Hypothyroidism, unspecified: Secondary | ICD-10-CM | POA: Diagnosis not present

## 2022-04-26 DIAGNOSIS — I10 Essential (primary) hypertension: Secondary | ICD-10-CM | POA: Diagnosis not present

## 2022-07-08 DIAGNOSIS — Z23 Encounter for immunization: Secondary | ICD-10-CM | POA: Diagnosis not present

## 2022-07-29 DIAGNOSIS — Z23 Encounter for immunization: Secondary | ICD-10-CM | POA: Diagnosis not present

## 2022-12-04 DIAGNOSIS — H04123 Dry eye syndrome of bilateral lacrimal glands: Secondary | ICD-10-CM | POA: Diagnosis not present

## 2022-12-26 DIAGNOSIS — H02831 Dermatochalasis of right upper eyelid: Secondary | ICD-10-CM | POA: Diagnosis not present

## 2022-12-26 DIAGNOSIS — H26491 Other secondary cataract, right eye: Secondary | ICD-10-CM | POA: Diagnosis not present

## 2022-12-26 DIAGNOSIS — H18593 Other hereditary corneal dystrophies, bilateral: Secondary | ICD-10-CM | POA: Diagnosis not present

## 2022-12-26 DIAGNOSIS — H02834 Dermatochalasis of left upper eyelid: Secondary | ICD-10-CM | POA: Diagnosis not present

## 2022-12-31 DIAGNOSIS — H02834 Dermatochalasis of left upper eyelid: Secondary | ICD-10-CM | POA: Diagnosis not present

## 2022-12-31 DIAGNOSIS — H02831 Dermatochalasis of right upper eyelid: Secondary | ICD-10-CM | POA: Diagnosis not present

## 2022-12-31 DIAGNOSIS — H26491 Other secondary cataract, right eye: Secondary | ICD-10-CM | POA: Diagnosis not present

## 2022-12-31 DIAGNOSIS — H18593 Other hereditary corneal dystrophies, bilateral: Secondary | ICD-10-CM | POA: Diagnosis not present

## 2023-01-22 DIAGNOSIS — G8929 Other chronic pain: Secondary | ICD-10-CM | POA: Diagnosis not present

## 2023-01-22 DIAGNOSIS — Z0001 Encounter for general adult medical examination with abnormal findings: Secondary | ICD-10-CM | POA: Diagnosis not present

## 2023-01-22 DIAGNOSIS — M1990 Unspecified osteoarthritis, unspecified site: Secondary | ICD-10-CM | POA: Diagnosis not present

## 2023-01-22 DIAGNOSIS — Z6827 Body mass index (BMI) 27.0-27.9, adult: Secondary | ICD-10-CM | POA: Diagnosis not present

## 2023-01-22 DIAGNOSIS — E039 Hypothyroidism, unspecified: Secondary | ICD-10-CM | POA: Diagnosis not present

## 2023-01-22 DIAGNOSIS — E663 Overweight: Secondary | ICD-10-CM | POA: Diagnosis not present

## 2023-01-22 DIAGNOSIS — I10 Essential (primary) hypertension: Secondary | ICD-10-CM | POA: Diagnosis not present

## 2023-01-22 DIAGNOSIS — R42 Dizziness and giddiness: Secondary | ICD-10-CM | POA: Diagnosis not present

## 2023-01-26 DIAGNOSIS — E039 Hypothyroidism, unspecified: Secondary | ICD-10-CM | POA: Diagnosis not present

## 2023-01-26 DIAGNOSIS — E782 Mixed hyperlipidemia: Secondary | ICD-10-CM | POA: Diagnosis not present

## 2023-01-26 DIAGNOSIS — I10 Essential (primary) hypertension: Secondary | ICD-10-CM | POA: Diagnosis not present

## 2023-02-24 DIAGNOSIS — H6122 Impacted cerumen, left ear: Secondary | ICD-10-CM | POA: Diagnosis not present

## 2023-02-25 DIAGNOSIS — I10 Essential (primary) hypertension: Secondary | ICD-10-CM | POA: Diagnosis not present

## 2023-02-25 DIAGNOSIS — E782 Mixed hyperlipidemia: Secondary | ICD-10-CM | POA: Diagnosis not present

## 2023-03-28 DIAGNOSIS — E782 Mixed hyperlipidemia: Secondary | ICD-10-CM | POA: Diagnosis not present

## 2023-03-28 DIAGNOSIS — E039 Hypothyroidism, unspecified: Secondary | ICD-10-CM | POA: Diagnosis not present

## 2023-03-28 DIAGNOSIS — I10 Essential (primary) hypertension: Secondary | ICD-10-CM | POA: Diagnosis not present

## 2023-06-07 ENCOUNTER — Ambulatory Visit
Admission: EM | Admit: 2023-06-07 | Discharge: 2023-06-07 | Disposition: A | Discharge status: Home / Self Care | Payer: Medicare Other | Attending: Family Medicine | Admitting: Family Medicine

## 2023-06-07 DIAGNOSIS — U071 COVID-19: Secondary | ICD-10-CM | POA: Insufficient documentation

## 2023-06-07 DIAGNOSIS — Z87891 Personal history of nicotine dependence: Secondary | ICD-10-CM | POA: Diagnosis not present

## 2023-06-07 DIAGNOSIS — J069 Acute upper respiratory infection, unspecified: Secondary | ICD-10-CM

## 2023-06-07 MED ORDER — BENZONATATE 100 MG PO CAPS: 100.0000 mg | ORAL_CAPSULE | Freq: Three times a day (TID) | ORAL | 0 refills | Status: DC

## 2023-06-07 NOTE — ED Provider Notes (Signed)
[facility]    CSN: [id number] Arrival date & time: 06/07/23  1034      History   Chief Complaint Chief Complaint  Patient presents with   Cough   Nasal Congestion    HPI Loretta Hunt is a 82 y.o. female.   Patient presenting today with 1 day history of dry cough, congestion.  Denies fever, chills, body aches, chest pain, shortness of breath, abdominal pain, nausea vomiting or diarrhea.  No known sick contacts recently.  Trying Coricidin and Tylenol with minimal relief.  No known history of chronic pulmonary disease.    Past Medical History:  Diagnosis Date   GERD (gastroesophageal reflux disease)    Heart murmur    Hx of adenomatous colonic polyps    Hypertension    Hypothyroidism    PUD (peptic ulcer disease) 1994   h.pylori   Rheumatoid arthritis(714.0)        Schatzki's ring 2004   s/p dilation    Patient Active Problem List   Diagnosis Date Noted   Syncope 03/30/2020   New onset atrial fibrillation (HCC) 03/30/2020   Colon adenomas 07/30/2011    Class: History of   Esophageal dysphagia 07/30/2011   Constipation, chronic 07/30/2011    Past Surgical History:  Procedure Laterality Date   ABDOMINAL HYSTERECTOMY     CATARACT EXTRACTION W/PHACO Right 02/05/2016   Procedure: CATARACT EXTRACTION PHACO AND INTRAOCULAR LENS PLACEMENT (IOC);  Surgeon: Gemma Payor, MD;  Location: [facility];  Service: Ophthalmology;  Laterality: Right;  CDE 12.32   CATARACT EXTRACTION W/PHACO Left 02/22/2016   Procedure: CATARACT EXTRACTION PHACO AND INTRAOCULAR LENS PLACEMENT (IOC);  Surgeon: Gemma Payor, MD;  Location: [facility];  Service: Ophthalmology;  Laterality: Left;  CDE: 16.94    COLONOSCOPY  08/2006   Anal papilloma, internal hemorrhoids. Recommended to have repeat colonoscopy in November 2012   COLONOSCOPY  2004   Melanosis coli (mild), couple of scattered sigmoid diverticula, small polyps and a descending colon. Adenomatous polyps.   COLONOSCOPY  08/26/2011    Procedure: COLONOSCOPY;  Surgeon: Corbin Ade, MD;  Location: [facility];  Service: Endoscopy;  Laterality: N/A;  8:30   ESOPHAGOGASTRODUODENOSCOPY  2004   Mild ulcerative reflux esophagitis, Schatzki ring dilated and disrupted, small hiatal hernia   FOOT SURGERY Left    fracture   knee replacement surgery     bilateral, 1994/1995    OB History   No obstetric history on file.      Home Medications    Prior to Admission medications   Medication Sig Start Date End Date Taking? Authorizing Provider  benzonatate (TESSALON) 100 MG capsule Take 1 capsule (100 mg total) by mouth every 8 (eight) hours. 06/07/23  Yes Particia Nearing, PA-C  HYDROcodone-acetaminophen (NORCO) 10-325 MG tablet TAKE ONE TABLET BY MOUTH EVERY 4 HOURS 01/23/23  Yes [provider]  amLODipine (NORVASC) 10 MG tablet Take 10 mg by mouth at bedtime.  03/29/20   [provider]  apixaban (ELIQUIS) 5 MG TABS tablet Take 1 tablet (5 mg total) by mouth 2 (two) times daily. 03/30/20   Danford, Earl Lites, MD  Ascorbic Acid (VITAMIN C) 1000 MG tablet Take 1,000 mg by mouth every morning.     [provider]  Cholecalciferol (VITAMIN D3) 5000 UNITS TABS Take 1 tablet by mouth daily.    [provider]  fish oil-omega-3 fatty acids 1000 MG capsule Take 1 g by mouth daily.     [provider]  furosemide (LASIX) 20 MG tablet Take 20 mg by mouth daily as needed for fluid or edema.    [provider]  hydrochlorothiazide (HYDRODIURIL) 25 MG tablet Take 25 mg by mouth every morning.  03/29/20   [provider]  levothyroxine (SYNTHROID, LEVOTHROID) 100 MCG tablet Take 100 mcg by mouth every morning.  07/08/11   [provider]  lisinopril (PRINIVIL,ZESTRIL) 20 MG tablet Take 10 mg by mouth every morning.     [provider]  metoprolol succinate (TOPROL-XL) 25 MG 24 hr tablet Take 25 mg by mouth every morning.     [provider]   Multiple Vitamins-Minerals (MULTIVITAMINS THER. W/MINERALS) TABS Take 1 tablet by mouth every morning.     [provider]    Family History Family History  Problem Relation Age of Onset   Stomach cancer Father    Colon cancer Neg Hx     Social History Social History   Tobacco Use   Smoking status: Former    Current packs/day: 0.00    Average packs/day: 0.5 packs/day for 18.0 years (9.0 ttl pk-yrs)    Types: Cigarettes    Start date: 12/13/1974    Quit date: 12/13/1992    Years since quitting: 30.5   Smokeless tobacco: Former    Quit date: 12/11/1992  Substance Use Topics   Alcohol use: Yes    Comment: a pint a week. None since 1994.   Drug use: No     Allergies   Patient has no known allergies.   Review of Systems Review of Systems PER HPI  Physical Exam Triage Vital Signs ED Triage Vitals  Encounter Vitals Group     BP 06/07/23 1047 (!) 163/94     Systolic BP Percentile --      Diastolic BP Percentile --      Pulse Rate 06/07/23 1047 77     Resp 06/07/23 1047 18     Temp 06/07/23 1047 98.2 F (36.8 C)     Temp Source 06/07/23 1047 Oral     SpO2 06/07/23 1047 98 %     Weight 06/07/23 1052 200 lb (90.7 kg)     Height --      Head Circumference --      Peak Flow --      Pain Score 06/07/23 1045 0     Pain Loc --      Pain Education --      Exclude from Growth Chart --    No data found.  Updated Vital Signs BP (!) 147/88 (BP Location: Left Arm)   Pulse 77   Temp 98.2 F (36.8 C) (Oral)   Resp 18   Wt 200 lb (90.7 kg)   SpO2 98%   BMI 27.12 kg/m   Visual Acuity Right Eye Distance:   Left Eye Distance:   Bilateral Distance:    Right Eye Near:   Left Eye Near:    Bilateral Near:     Physical Exam Vitals and nursing note reviewed.  Constitutional:      Appearance: Normal appearance.  HENT:     Head: Atraumatic.     Right Ear: Tympanic membrane and external ear normal.     Left Ear: Tympanic membrane and external ear normal.      Nose: Rhinorrhea present.     Mouth/Throat:     Mouth: Mucous membranes are moist.     Pharynx: Posterior oropharyngeal erythema present.  Eyes:     Extraocular  Movements: Extraocular movements intact.     Conjunctiva/sclera: Conjunctivae normal.  Cardiovascular:     Rate and Rhythm: Normal rate and regular rhythm.     Heart sounds: Normal heart sounds.  Pulmonary:     Effort: Pulmonary effort is normal.     Breath sounds: Normal breath sounds. No wheezing.  Musculoskeletal:        General: Normal range of motion.     Cervical back: Normal range of motion and neck supple.  Skin:    General: Skin is warm and dry.  Neurological:     Mental Status: She is alert and oriented to person, place, and time.  Psychiatric:        Mood and Affect: Mood normal.        Thought Content: Thought content normal.      UC Treatments / Results  Labs (all labs ordered are listed, but only abnormal results are displayed) Labs Reviewed  SARS CORONAVIRUS 2 (TAT 6-24 HRS)    EKG   Radiology No results found.  Procedures Procedures (including critical care time)  Medications Ordered in UC Medications - No data to display  Initial Impression / Assessment and Plan / UC Course  I have reviewed the triage vital signs and the nursing notes.  Pertinent labs & imaging results that were available during my care of the patient were reviewed by me and considered in my medical decision making (see chart for details).     Vitals and exam reassuring today, suspicious for viral respiratory infection.  COVID testing pending, discussed Tessalon, supportive over-the-counter medications and home care.  Return for worsening symptoms.  Final Clinical Impressions(s) / UC Diagnoses   Final diagnoses:  Viral URI with cough     Discharge Instructions      We have tested you for COVID today, these results should be back tomorrow and someone will call with positive results to discuss antiviral therapy  if it does come back positive.  In the meantime, I have sent over a cough Perles and you may take Coricidin HBP, Tylenol, Flonase, Mucinex, nasal saline    ED Prescriptions     Medication Sig Dispense Auth. Provider   benzonatate (TESSALON) 100 MG capsule Take 1 capsule (100 mg total) by mouth every 8 (eight) hours. 21 capsule Particia Nearing, [place]      PDMP not reviewed this encounter.   Particia Nearing, [place] 06/07/23 1552

## 2023-06-07 NOTE — Discharge Instructions (Addendum)
We have tested you for COVID today, these results should be back tomorrow and someone will call with positive results to discuss antiviral therapy if it does come back positive.  In the meantime, I have sent over a cough Perles and you may take Coricidin HBP, Tylenol, Flonase, Mucinex, nasal saline

## 2023-06-07 NOTE — ED Triage Notes (Signed)
Pt c/o dry cough and congestion.  Home interventions: Coricidin, tylenol (last taken around 0700)  Started: last night

## 2023-06-08 LAB — SARS CORONAVIRUS 2 (TAT 6-24 HRS): SARS Coronavirus 2: POSITIVE — AB

## 2023-06-09 ENCOUNTER — Telehealth: Payer: Self-pay

## 2023-06-09 MED ORDER — NIRMATRELVIR/RITONAVIR (PAXLOVID) TABLET (RENAL DOSING): 2.0000 | ORAL_TABLET | Freq: Two times a day (BID) | ORAL | 0 refills | Status: DC

## 2023-06-09 MED ORDER — NIRMATRELVIR/RITONAVIR (PAXLOVID) TABLET (RENAL DOSING): ORAL_TABLET | ORAL | 0 refills | Status: DC

## 2023-06-28 DIAGNOSIS — G894 Chronic pain syndrome: Secondary | ICD-10-CM | POA: Diagnosis not present

## 2023-06-28 DIAGNOSIS — I10 Essential (primary) hypertension: Secondary | ICD-10-CM | POA: Diagnosis not present

## 2023-06-28 DIAGNOSIS — M19072 Primary osteoarthritis, left ankle and foot: Secondary | ICD-10-CM | POA: Diagnosis not present

## 2023-06-28 DIAGNOSIS — E039 Hypothyroidism, unspecified: Secondary | ICD-10-CM | POA: Diagnosis not present

## 2023-07-01 DIAGNOSIS — H26491 Other secondary cataract, right eye: Secondary | ICD-10-CM | POA: Diagnosis not present

## 2023-07-01 DIAGNOSIS — H02834 Dermatochalasis of left upper eyelid: Secondary | ICD-10-CM | POA: Diagnosis not present

## 2023-07-01 DIAGNOSIS — H02831 Dermatochalasis of right upper eyelid: Secondary | ICD-10-CM | POA: Diagnosis not present

## 2023-07-30 DIAGNOSIS — Z23 Encounter for immunization: Secondary | ICD-10-CM | POA: Diagnosis not present

## 2023-08-21 ENCOUNTER — Other Ambulatory Visit (HOSPITAL_COMMUNITY): Payer: Self-pay | Admitting: Family Medicine

## 2023-08-21 ENCOUNTER — Ambulatory Visit (HOSPITAL_COMMUNITY)
Admission: RE | Admit: 2023-08-21 | Discharge: 2023-08-21 | Disposition: A | Discharge status: Home / Self Care | Payer: Medicare Other | Source: Ambulatory Visit | Attending: Family Medicine | Admitting: Family Medicine

## 2023-08-21 DIAGNOSIS — M25551 Pain in right hip: Secondary | ICD-10-CM | POA: Diagnosis not present

## 2023-08-21 DIAGNOSIS — E663 Overweight: Secondary | ICD-10-CM | POA: Diagnosis not present

## 2023-08-21 DIAGNOSIS — M1611 Unilateral primary osteoarthritis, right hip: Secondary | ICD-10-CM | POA: Diagnosis not present

## 2023-08-21 DIAGNOSIS — R52 Pain, unspecified: Secondary | ICD-10-CM | POA: Insufficient documentation

## 2023-08-21 DIAGNOSIS — S79911A Unspecified injury of right hip, initial encounter: Secondary | ICD-10-CM | POA: Diagnosis not present

## 2023-08-21 DIAGNOSIS — Z6829 Body mass index (BMI) 29.0-29.9, adult: Secondary | ICD-10-CM | POA: Diagnosis not present

## 2023-08-22 ENCOUNTER — Other Ambulatory Visit (HOSPITAL_COMMUNITY): Payer: Self-pay | Admitting: Family Medicine

## 2023-08-22 DIAGNOSIS — M25551 Pain in right hip: Secondary | ICD-10-CM

## 2023-08-22 DIAGNOSIS — S79911A Unspecified injury of right hip, initial encounter: Secondary | ICD-10-CM

## 2023-08-28 ENCOUNTER — Ambulatory Visit (HOSPITAL_COMMUNITY)
Admission: RE | Admit: 2023-08-28 | Discharge: 2023-08-28 | Disposition: A | Discharge status: Home / Self Care | Payer: Medicare Other | Source: Ambulatory Visit | Attending: Family Medicine | Admitting: Family Medicine

## 2023-08-28 DIAGNOSIS — S76311A Strain of muscle, fascia and tendon of the posterior muscle group at thigh level, right thigh, initial encounter: Secondary | ICD-10-CM | POA: Diagnosis not present

## 2023-08-28 DIAGNOSIS — S76312A Strain of muscle, fascia and tendon of the posterior muscle group at thigh level, left thigh, initial encounter: Secondary | ICD-10-CM | POA: Diagnosis not present

## 2023-08-28 DIAGNOSIS — M25551 Pain in right hip: Secondary | ICD-10-CM | POA: Insufficient documentation

## 2023-08-28 DIAGNOSIS — S79911A Unspecified injury of right hip, initial encounter: Secondary | ICD-10-CM | POA: Diagnosis not present

## 2023-08-28 DIAGNOSIS — M160 Bilateral primary osteoarthritis of hip: Secondary | ICD-10-CM | POA: Diagnosis not present

## 2023-08-28 DIAGNOSIS — S3210XA Unspecified fracture of sacrum, initial encounter for closed fracture: Secondary | ICD-10-CM | POA: Diagnosis not present

## 2023-09-16 DIAGNOSIS — M545 Low back pain, unspecified: Secondary | ICD-10-CM | POA: Diagnosis not present

## 2023-09-16 DIAGNOSIS — S32591A Other specified fracture of right pubis, initial encounter for closed fracture: Secondary | ICD-10-CM | POA: Diagnosis not present

## 2023-10-28 DIAGNOSIS — E039 Hypothyroidism, unspecified: Secondary | ICD-10-CM | POA: Diagnosis not present

## 2023-10-28 DIAGNOSIS — I10 Essential (primary) hypertension: Secondary | ICD-10-CM | POA: Diagnosis not present

## 2023-10-28 DIAGNOSIS — E782 Mixed hyperlipidemia: Secondary | ICD-10-CM | POA: Diagnosis not present

## 2023-11-05 DIAGNOSIS — M545 Low back pain, unspecified: Secondary | ICD-10-CM | POA: Diagnosis not present

## 2023-11-07 DIAGNOSIS — M545 Low back pain, unspecified: Secondary | ICD-10-CM | POA: Diagnosis not present

## 2023-11-11 DIAGNOSIS — M545 Low back pain, unspecified: Secondary | ICD-10-CM | POA: Diagnosis not present

## 2023-11-13 DIAGNOSIS — M545 Low back pain, unspecified: Secondary | ICD-10-CM | POA: Diagnosis not present

## 2023-11-17 DIAGNOSIS — M545 Low back pain, unspecified: Secondary | ICD-10-CM | POA: Diagnosis not present

## 2023-11-24 DIAGNOSIS — M545 Low back pain, unspecified: Secondary | ICD-10-CM | POA: Diagnosis not present

## 2023-11-26 DIAGNOSIS — M545 Low back pain, unspecified: Secondary | ICD-10-CM | POA: Diagnosis not present

## 2023-12-02 DIAGNOSIS — M545 Low back pain, unspecified: Secondary | ICD-10-CM | POA: Diagnosis not present

## 2024-02-18 DIAGNOSIS — E039 Hypothyroidism, unspecified: Secondary | ICD-10-CM | POA: Diagnosis not present

## 2024-02-18 DIAGNOSIS — G894 Chronic pain syndrome: Secondary | ICD-10-CM | POA: Diagnosis not present

## 2024-02-18 DIAGNOSIS — E663 Overweight: Secondary | ICD-10-CM | POA: Diagnosis not present

## 2024-02-18 DIAGNOSIS — E782 Mixed hyperlipidemia: Secondary | ICD-10-CM | POA: Diagnosis not present

## 2024-02-18 DIAGNOSIS — M1990 Unspecified osteoarthritis, unspecified site: Secondary | ICD-10-CM | POA: Diagnosis not present

## 2024-02-18 DIAGNOSIS — I10 Essential (primary) hypertension: Secondary | ICD-10-CM | POA: Diagnosis not present

## 2024-02-18 DIAGNOSIS — E559 Vitamin D deficiency, unspecified: Secondary | ICD-10-CM | POA: Diagnosis not present

## 2024-02-18 DIAGNOSIS — Z6829 Body mass index (BMI) 29.0-29.9, adult: Secondary | ICD-10-CM | POA: Diagnosis not present

## 2024-03-01 DIAGNOSIS — M25551 Pain in right hip: Secondary | ICD-10-CM | POA: Diagnosis not present

## 2024-06-14 DIAGNOSIS — Z6828 Body mass index (BMI) 28.0-28.9, adult: Secondary | ICD-10-CM | POA: Diagnosis not present

## 2024-06-14 DIAGNOSIS — M7122 Synovial cyst of popliteal space [Baker], left knee: Secondary | ICD-10-CM | POA: Diagnosis not present

## 2024-06-14 DIAGNOSIS — E663 Overweight: Secondary | ICD-10-CM | POA: Diagnosis not present

## 2024-06-14 DIAGNOSIS — R233 Spontaneous ecchymoses: Secondary | ICD-10-CM | POA: Diagnosis not present

## 2024-06-22 DIAGNOSIS — Z23 Encounter for immunization: Secondary | ICD-10-CM | POA: Diagnosis not present

## 2024-07-12 DIAGNOSIS — Z23 Encounter for immunization: Secondary | ICD-10-CM | POA: Diagnosis not present

## 2024-08-06 ENCOUNTER — Other Ambulatory Visit: Payer: Self-pay

## 2024-08-06 ENCOUNTER — Encounter: Payer: Self-pay | Admitting: Emergency Medicine

## 2024-08-06 ENCOUNTER — Ambulatory Visit
Admission: EM | Admit: 2024-08-06 | Discharge: 2024-08-06 | Disposition: A | Discharge status: Home / Self Care | Attending: Nurse Practitioner | Admitting: Nurse Practitioner

## 2024-08-06 DIAGNOSIS — M069 Rheumatoid arthritis, unspecified: Secondary | ICD-10-CM | POA: Diagnosis not present

## 2024-08-06 MED ORDER — DEXAMETHASONE SOD PHOSPHATE PF 10 MG/ML IJ SOLN
10.0000 mg | Freq: Once | INTRAMUSCULAR | Status: AC
  Administered 2024-08-06: 10 mg via INTRAMUSCULAR

## 2024-08-06 NOTE — ED Provider Notes (Signed)
 [facility]    CSN: [id number] Arrival date & time: 08/06/24  1001      History   Chief Complaint Chief Complaint  Patient presents with   Generalized Body Aches    HPI NIKOLINA SIMERSON is a 83 y.o. female.   Patient presents today with daughter.  Patient complains of allover body stiffness and soreness.  Reports history of arthritis everywhere.  No recent fever, cough, congestion, sore throat.  No recent fall or trauma to any joint or bone.  She reports history of rheumatoid arthritis.  Currently taking Tylenol  a few times daily to manage pain.  Reports has been prescribed Norco in the past and it helps well with her pain.  Her PCP recently retired so she cannot get the [employer] refilled.  She has received steroid injections in the past which helped with the stiffness until she can get her refill and is wondering if she can get one today.  Reports she has a PCP visit in January.  She is looking to get a PCP visit sooner.    Past Medical History:  Diagnosis Date   GERD (gastroesophageal reflux disease)    Heart murmur    Hx of adenomatous colonic polyps    Hypertension    Hypothyroidism    PUD (peptic ulcer disease) 1994   h.pylori   Rheumatoid arthritis(714.0)        Schatzki's ring 2004   s/p dilation    Patient Active Problem List   Diagnosis Date Noted   Syncope 03/30/2020   New onset atrial fibrillation (HCC) 03/30/2020   Colon adenomas 07/30/2011    Class: History of   Esophageal dysphagia 07/30/2011   Constipation, chronic 07/30/2011    Past Surgical History:  Procedure Laterality Date   ABDOMINAL HYSTERECTOMY     CATARACT EXTRACTION W/PHACO Right 02/05/2016   Procedure: CATARACT EXTRACTION PHACO AND INTRAOCULAR LENS PLACEMENT (IOC);  Surgeon: Cherene Mania, MD;  Location: [facility];  Service: Ophthalmology;  Laterality: Right;  CDE 12.32   CATARACT EXTRACTION W/PHACO Left 02/22/2016   Procedure: CATARACT EXTRACTION PHACO AND INTRAOCULAR LENS PLACEMENT  (IOC);  Surgeon: Cherene Mania, MD;  Location: [facility];  Service: Ophthalmology;  Laterality: Left;  CDE: 16.94    COLONOSCOPY  08/2006   Anal papilloma, internal hemorrhoids. Recommended to have repeat colonoscopy in November 2012   COLONOSCOPY  2004   Melanosis coli (mild), couple of scattered sigmoid diverticula, small polyps and a descending colon. Adenomatous polyps.   COLONOSCOPY  08/26/2011   Procedure: COLONOSCOPY;  Surgeon: Lamar CHRISTELLA Hollingshead, MD;  Location: [facility];  Service: Endoscopy;  Laterality: N/A;  8:30   ESOPHAGOGASTRODUODENOSCOPY  2004   Mild ulcerative reflux esophagitis, Schatzki ring dilated and disrupted, small hiatal hernia   FOOT SURGERY Left    fracture   knee replacement surgery     bilateral, 1994/1995    OB History   No obstetric history on file.      Home Medications    Prior to Admission medications   Medication Sig Start Date End Date Taking? Authorizing Provider  amLODipine (NORVASC) 10 MG tablet Take 10 mg by mouth at bedtime.  03/29/20   [provider]  apixaban  (ELIQUIS ) 5 MG TABS tablet Take 1 tablet (5 mg total) by mouth 2 (two) times daily. 03/30/20   Danford, Lonni SQUIBB, MD  Ascorbic Acid (VITAMIN C) 1000 MG tablet Take 1,000 mg by mouth every morning.     [provider]  Cholecalciferol (VITAMIN  D3) 5000 UNITS TABS Take 1 tablet by mouth daily.    [provider]  fish oil-omega-3 fatty acids 1000 MG capsule Take 1 g by mouth daily.     [provider]  furosemide (LASIX) 20 MG tablet Take 20 mg by mouth daily as needed for fluid or edema.    [provider]  hydrochlorothiazide (HYDRODIURIL) 25 MG tablet Take 25 mg by mouth every morning.  03/29/20   [provider]  HYDROcodone-acetaminophen  (NORCO) 10-325 MG tablet TAKE ONE TABLET BY MOUTH EVERY 4 HOURS 01/23/23   [provider]  levothyroxine (SYNTHROID, LEVOTHROID) 100 MCG tablet Take 100 mcg by mouth every morning.  07/08/11    [provider]  lisinopril (PRINIVIL,ZESTRIL) 20 MG tablet Take 10 mg by mouth every morning.     [provider]  metoprolol  succinate (TOPROL -XL) 25 MG 24 hr tablet Take 25 mg by mouth every morning.     [provider]  Multiple Vitamins-Minerals (MULTIVITAMINS THER. W/MINERALS) TABS Take 1 tablet by mouth every morning.     [provider]    Family History Family History  Problem Relation Age of Onset   Stomach cancer Father    Colon cancer Neg Hx     Social History Social History   Tobacco Use   Smoking status: Former    Current packs/day: 0.00    Average packs/day: 0.5 packs/day for 18.0 years (9.0 ttl pk-yrs)    Types: Cigarettes    Start date: 12/13/1974    Quit date: 12/13/1992    Years since quitting: 31.6   Smokeless tobacco: Former    Quit date: 12/11/1992  Substance Use Topics   Alcohol use: Yes    Comment: a pint a week. None since 1994.   Drug use: No     Allergies   Patient has no known allergies.   Review of Systems Review of Systems Per HPI  Physical Exam Triage Vital Signs ED Triage Vitals  Encounter Vitals Group     BP 08/06/24 1112 113/74     Girls Systolic BP Percentile --      Girls Diastolic BP Percentile --      Boys Systolic BP Percentile --      Boys Diastolic BP Percentile --      Pulse Rate 08/06/24 1112 68     Resp 08/06/24 1112 20     Temp 08/06/24 1112 (!) 97.5 F (36.4 C)     Temp Source 08/06/24 1112 Oral     SpO2 08/06/24 1112 98 %     Weight --      Height --      Head Circumference --      Peak Flow --      Pain Score 08/06/24 1111 8     Pain Loc --      Pain Education --      Exclude from Growth Chart --    No data found.  Updated Vital Signs BP 113/74 (BP Location: Right Arm)   Pulse 68   Temp (!) 97.5 F (36.4 C) (Oral)   Resp 20   SpO2 98%   Visual Acuity Right Eye Distance:   Left Eye Distance:   Bilateral Distance:    Right Eye Near:   Left Eye Near:     Bilateral Near:     Physical Exam Vitals and nursing note reviewed.  Constitutional:      General: She is not in acute distress.  Appearance: Normal appearance. She is not toxic-appearing.  HENT:     Head: Normocephalic and atraumatic.     Mouth/Throat:     Mouth: Mucous membranes are moist.     Pharynx: Oropharynx is clear.  Pulmonary:     Effort: Pulmonary effort is normal. No respiratory distress.  Musculoskeletal:     Cervical back: Normal range of motion.  Lymphadenopathy:     Cervical: No cervical adenopathy.  Skin:    General: Skin is warm and dry.     Capillary Refill: Capillary refill takes less than 2 seconds.     Coloration: Skin is not jaundiced or pale.     Findings: No erythema.  Neurological:     Mental Status: She is alert and oriented to person, place, and time.  Psychiatric:        Behavior: Behavior is cooperative.      UC Treatments / Results  Labs (all labs ordered are listed, but only abnormal results are displayed) Labs Reviewed - No data to display  EKG   Radiology No results found.  Procedures Procedures (including critical care time)  Medications Ordered in UC Medications  dexamethasone  (DECADRON ) injection 10 mg (10 mg Intramuscular Given 08/06/24 1143)    Initial Impression / Assessment and Plan / UC Course  I have reviewed the triage vital signs and the nursing notes.  Pertinent labs & imaging results that were available during my care of the patient were reviewed by me and considered in my medical decision making (see chart for details).   Patient is well-appearing, normotensive, afebrile, not tachycardic, not tachypneic, oxygenating well on room air.   1. Rheumatoid arthritis, involving unspecified site, unspecified whether rheumatoid factor present (HCC) No red flags Decadron  10 mg IM given in urgent care today Continue Tylenol  Resources given today to establish care with new PCP sooner than January   The patient was  given the opportunity to ask questions.  All questions answered to their satisfaction.  The patient is in agreement to this plan.   Final Clinical Impressions(s) / UC Diagnoses   Final diagnoses:  Rheumatoid arthritis, involving unspecified site, unspecified whether rheumatoid factor present [facility])     Discharge Instructions      We gave you a shot of steroid medicine today to help with pain and stiffness.  Please follow up with a PCP regarding ongoing pain control.    ED Prescriptions   None    PDMP not reviewed this encounter.   Chandra Harlene LABOR, NP 08/06/24 [phone]

## 2024-08-06 NOTE — ED Triage Notes (Addendum)
 Pt family reports pt is out of norco and is looking for other options for pain management until can get in with another pcp. Currently taking tylenol  as needed. Has had steroid injection monthly to avoid taking narcotics frequently.  Pt pcp moved practices and is currently in between pcp. Reports has appt in January. Discussed with pt in triage how to look into other pcp office.

## 2024-08-06 NOTE — Discharge Instructions (Addendum)
 We gave you a shot of steroid medicine today to help with pain and stiffness.  Please follow up with a PCP regarding ongoing pain control.
